# Patient Record
Sex: Female | Born: 1970 | Race: White | Hispanic: No | Marital: Single | Smoking: Current every day smoker
Health system: Southern US, Community
[De-identification: ages and names within clinical notes are randomized; demographics above are authoritative.]

## PROBLEM LIST (undated history)

## (undated) DIAGNOSIS — E785 Hyperlipidemia, unspecified: Secondary | ICD-10-CM

## (undated) DIAGNOSIS — F32A Depression, unspecified: Secondary | ICD-10-CM

## (undated) DIAGNOSIS — F329 Major depressive disorder, single episode, unspecified: Secondary | ICD-10-CM

## (undated) HISTORY — DX: Depression, unspecified: F32.A

## (undated) HISTORY — DX: Major depressive disorder, single episode, unspecified: F32.9

## (undated) HISTORY — DX: Hyperlipidemia, unspecified: E78.5

---

## 2003-08-03 ENCOUNTER — Other Ambulatory Visit: Admission: RE | Admit: 2003-08-03 | Discharge: 2003-08-03 | Payer: Self-pay | Admitting: Obstetrics and Gynecology

## 2004-12-25 ENCOUNTER — Emergency Department (HOSPITAL_COMMUNITY): Admission: EM | Admit: 2004-12-25 | Discharge: 2004-12-25 | Payer: Self-pay | Admitting: Emergency Medicine

## 2006-01-18 ENCOUNTER — Other Ambulatory Visit: Admission: RE | Admit: 2006-01-18 | Discharge: 2006-01-18 | Payer: Self-pay | Admitting: Obstetrics and Gynecology

## 2006-01-18 ENCOUNTER — Encounter: Payer: Self-pay | Admitting: Family Medicine

## 2006-08-15 ENCOUNTER — Emergency Department (HOSPITAL_COMMUNITY): Admission: EM | Admit: 2006-08-15 | Discharge: 2006-08-15 | Payer: Self-pay | Admitting: Family Medicine

## 2007-04-12 ENCOUNTER — Encounter (INDEPENDENT_AMBULATORY_CARE_PROVIDER_SITE_OTHER): Payer: Self-pay | Admitting: *Deleted

## 2007-04-12 ENCOUNTER — Encounter: Payer: Self-pay | Admitting: Family Medicine

## 2007-04-12 LAB — CONVERTED CEMR LAB
Albumin: 3.8 g/dL
Alkaline Phosphatase: 87 units/L
BUN: 9 mg/dL
CO2, serum: 24 mmol/L
Calcium: 9.1 mg/dL
Creatinine, Ser: 0.9 mg/dL
Glucose, Bld: 82 mg/dL
Sodium, serum: 135 mmol/L

## 2007-10-03 ENCOUNTER — Encounter (INDEPENDENT_AMBULATORY_CARE_PROVIDER_SITE_OTHER): Payer: Self-pay | Admitting: *Deleted

## 2007-10-03 ENCOUNTER — Encounter: Payer: Self-pay | Admitting: Family Medicine

## 2007-10-03 LAB — CONVERTED CEMR LAB
Chlamydia, Swab/Urine, PCR: NEGATIVE
Hemoglobin: 15.9 g/dL
MCV: 91.8 fL
RBC count: 5.07 10*6/uL
RPR Ser Ql: NEGATIVE
WBC, blood: 6.5 10*3/uL

## 2008-02-26 ENCOUNTER — Other Ambulatory Visit: Admission: RE | Admit: 2008-02-26 | Discharge: 2008-02-26 | Payer: Self-pay | Admitting: Family Medicine

## 2008-02-26 ENCOUNTER — Encounter: Payer: Self-pay | Admitting: Family Medicine

## 2008-02-26 ENCOUNTER — Ambulatory Visit: Payer: Self-pay | Admitting: Family Medicine

## 2008-02-26 DIAGNOSIS — F17200 Nicotine dependence, unspecified, uncomplicated: Secondary | ICD-10-CM

## 2008-02-26 DIAGNOSIS — F329 Major depressive disorder, single episode, unspecified: Secondary | ICD-10-CM

## 2008-02-26 DIAGNOSIS — F32A Depression, unspecified: Secondary | ICD-10-CM | POA: Insufficient documentation

## 2008-02-26 DIAGNOSIS — N760 Acute vaginitis: Secondary | ICD-10-CM | POA: Insufficient documentation

## 2008-03-03 ENCOUNTER — Encounter (INDEPENDENT_AMBULATORY_CARE_PROVIDER_SITE_OTHER): Payer: Self-pay | Admitting: *Deleted

## 2008-03-30 ENCOUNTER — Encounter (INDEPENDENT_AMBULATORY_CARE_PROVIDER_SITE_OTHER): Payer: Self-pay | Admitting: *Deleted

## 2008-05-11 ENCOUNTER — Ambulatory Visit: Payer: Self-pay | Admitting: Family Medicine

## 2008-05-11 DIAGNOSIS — N920 Excessive and frequent menstruation with regular cycle: Secondary | ICD-10-CM | POA: Insufficient documentation

## 2008-05-14 ENCOUNTER — Telehealth (INDEPENDENT_AMBULATORY_CARE_PROVIDER_SITE_OTHER): Payer: Self-pay | Admitting: *Deleted

## 2008-05-14 ENCOUNTER — Encounter: Payer: Self-pay | Admitting: Family Medicine

## 2008-05-14 LAB — CONVERTED CEMR LAB
Basophils Absolute: 0.2 K/uL — ABNORMAL HIGH
Basophils Relative: 3.3 % — ABNORMAL HIGH
Eosinophils Absolute: 0.4 K/uL
Eosinophils Relative: 8.1 % — ABNORMAL HIGH
HCT: 40.7 %
Hemoglobin: 14.2 g/dL
Lymphocytes Relative: 52.5 % — ABNORMAL HIGH
MCHC: 34.8 g/dL
MCV: 91.2 fL
Monocytes Absolute: 0.7 K/uL
Monocytes Relative: 14.9 % — ABNORMAL HIGH
Neutro Abs: 1 K/uL — ABNORMAL LOW
Neutrophils Relative %: 21.2 % — ABNORMAL LOW
Platelets: 244 K/uL
RBC: 4.46 M/uL
RDW: 11.7 %
WBC: 4.7 10*3/microliter

## 2008-06-17 ENCOUNTER — Ambulatory Visit: Payer: Self-pay | Admitting: Family Medicine

## 2008-06-22 ENCOUNTER — Encounter (INDEPENDENT_AMBULATORY_CARE_PROVIDER_SITE_OTHER): Payer: Self-pay | Admitting: *Deleted

## 2008-06-22 LAB — CONVERTED CEMR LAB
Basophils Absolute: 0 10*3/uL (ref 0.0–0.1)
Eosinophils Absolute: 0.2 10*3/uL (ref 0.0–0.7)
HCT: 42.2 % (ref 36.0–46.0)
Lymphs Abs: 2.7 10*3/uL (ref 0.7–4.0)
MCV: 90.8 fL (ref 78.0–100.0)
Monocytes Absolute: 0.5 10*3/uL (ref 0.1–1.0)
Platelets: 256 10*3/uL (ref 150.0–400.0)
RDW: 11.9 % (ref 11.5–14.6)

## 2009-04-26 ENCOUNTER — Telehealth (INDEPENDENT_AMBULATORY_CARE_PROVIDER_SITE_OTHER): Payer: Self-pay | Admitting: *Deleted

## 2009-05-26 ENCOUNTER — Ambulatory Visit: Payer: Self-pay | Admitting: Family

## 2009-06-15 ENCOUNTER — Telehealth (INDEPENDENT_AMBULATORY_CARE_PROVIDER_SITE_OTHER): Payer: Self-pay | Admitting: *Deleted

## 2009-09-27 ENCOUNTER — Telehealth: Payer: Self-pay | Admitting: Family Medicine

## 2009-10-05 ENCOUNTER — Telehealth: Payer: Self-pay | Admitting: Family Medicine

## 2009-11-15 ENCOUNTER — Ambulatory Visit: Payer: Self-pay | Admitting: Family Medicine

## 2009-11-15 ENCOUNTER — Other Ambulatory Visit: Admission: RE | Admit: 2009-11-15 | Discharge: 2009-11-15 | Payer: Self-pay | Admitting: Family Medicine

## 2009-11-16 LAB — CONVERTED CEMR LAB
ALT: 13 units/L (ref 0–35)
BUN: 16 mg/dL (ref 6–23)
CO2: 21 meq/L (ref 19–32)
Chloride: 106 meq/L (ref 96–112)
Cholesterol: 167 mg/dL (ref 0–200)
Eosinophils Relative: 2.3 % (ref 0.0–5.0)
Glucose, Bld: 86 mg/dL (ref 70–99)
HCT: 41.5 % (ref 36.0–46.0)
Lymphs Abs: 2.2 10*3/uL (ref 0.7–4.0)
MCV: 89.3 fL (ref 78.0–100.0)
Monocytes Absolute: 0.5 10*3/uL (ref 0.1–1.0)
Platelets: 261 10*3/uL (ref 150.0–400.0)
Potassium: 3.9 meq/L (ref 3.5–5.1)
RDW: 13.4 % (ref 11.5–14.6)
TSH: 0.75 microintl units/mL (ref 0.35–5.50)
Total Bilirubin: 0.4 mg/dL (ref 0.3–1.2)
Vit D, 25-Hydroxy: 31 ng/mL (ref 30–89)
WBC: 5.7 10*3/uL (ref 4.5–10.5)

## 2009-11-18 ENCOUNTER — Encounter (INDEPENDENT_AMBULATORY_CARE_PROVIDER_SITE_OTHER): Payer: Self-pay | Admitting: *Deleted

## 2010-04-10 ENCOUNTER — Encounter: Payer: Self-pay | Admitting: Family Medicine

## 2010-04-21 NOTE — Progress Notes (Signed)
Summary: Sertraline Refill  Phone Note Refill Request Message from:  Fax from Pharmacy on September 27, 2009 4:34 PM  Refills Requested: Medication #1:  SERTRALINE HCL 100 MG TABS take one tablet daily   Dosage confirmed as above?Dosage Confirmed   Brand Name Necessary? No   Supply Requested: 1 month   Last Refilled: 08/23/2009  Method Requested: Electronic Next Appointment Scheduled: No future appointments on file Initial call taken by: Glendell Docker CMA,  September 27, 2009 4:34 PM  Follow-up for Phone Call        ok for #30, 6 refills. Follow-up by: Neena Rhymes MD,  September 28, 2009 9:57 AM    Prescriptions: SERTRALINE HCL 100 MG TABS (SERTRALINE HCL) take one tablet daily  #30 x 6   Entered by:   Lucious Groves   Authorized by:   Neena Rhymes MD   Signed by:   Lucious Groves on 09/28/2009   Method used:   Electronically to        [facility]  [phone]* (retail)       [address]       [place], [place]  [address]       Ph: [phone] or [phone]       Fax: [phone]   RxID:   [id number]

## 2010-04-21 NOTE — Progress Notes (Signed)
Summary: sertraline refill   Phone Note Refill Request Message from:  Fax from Pharmacy on June 15, 2009 9:23 AM  Refills Requested: Medication #1:  SERTRALINE HCL 100 MG TABS take one tablet daily [facility] [address] fax [phone]   Method Requested: Fax to Local Pharmacy Next Appointment Scheduled: no appt Initial call taken by: Barb Merino,  June 15, 2009 9:24 AM  Follow-up for Phone Call        previous prescription sent to wrong pharmacy...Marland KitchenMarland KitchenDoristine Devoid  June 15, 2009 12:18 PM     Prescriptions: SERTRALINE HCL 100 MG TABS (SERTRALINE HCL) take one tablet daily  #30 x 2   Entered by:   Doristine Devoid   Authorized by:   Neena Rhymes MD   Signed by:   Doristine Devoid on 06/15/2009   Method used:   Electronically to        [facility]  [phone]* (retail)       [address]       [place], [place]  [address]       Ph: [phone] or [phone]       Fax: [phone]   RxID:   [id number]

## 2010-04-21 NOTE — Letter (Signed)
Summary: Results Follow up Letter  [place] [facility]  [address] [place], [place] [address]   Phone: [phone]  Fax: [phone]    11/18/2009 MRN: [id number]  Angela Evans [address] [place], [place]  [address]  Dear Ms. Brinlee,  The following are the results of your recent test(s):  Test         Result    Pap Smear:        Normal __X___  Not Normal _____ Comments: REPEAT 1 YR. ______________________________________________________ Cholesterol: LDL(Bad cholesterol):         Your goal is less than:         HDL (Good cholesterol):       Your goal is more than: Comments:  ______________________________________________________ Mammogram:        Normal _____  Not Normal _____ Comments:  ___________________________________________________________________ Hemoccult:        Normal _____  Not normal _______ Comments:    _____________________________________________________________________ Other Tests:    We routinely do not discuss normal results over the telephone.  If you desire a copy of the results, or you have any questions about this information we can discuss them at your next office visit.   Sincerely,

## 2010-04-21 NOTE — Progress Notes (Signed)
Summary: refill -   Phone Note Refill Request Message from:  Patient on October 05, 2009 10:04 AM  Refills Requested: Medication #1:  SERTRALINE HCL 100 MG TABS take one tablet daily refill on 071211 was faxed to wrong  pharmacy - please resend to [facility] [place] to refill again?  Initial call taken by: Okey Regal [place],  October 05, 2009 10:05 AM  Follow-up for Phone Call        done.  please notify pt. Follow-up by: Neena Rhymes MD,  October 05, 2009 11:36 AM  Additional Follow-up for Phone Call Additional follow up Details #1::        Pt is aware that med has been refilled Additional Follow-up by: Kathrynn Speed CMA,  October 05, 2009 11:59 AM    Prescriptions: SERTRALINE HCL 100 MG TABS (SERTRALINE HCL) take one tablet daily  #30 x 6   Entered and Authorized by:   Neena Rhymes MD   Signed by:   Neena Rhymes MD on 10/05/2009   Method used:   Electronically to        [facility]* (retail)       [address]       [place], [place]  [id number]       Ph: [phone]       Fax: [phone]   RxID:   [id number]

## 2010-04-21 NOTE — Assessment & Plan Note (Signed)
Summary: cpx/pap/fasting/kn   Vital Signs:  Patient profile:   40 year old female Height:      69.50 inches Weight:      191 pounds BMI:     27.90 Pulse rate:   74 / minute BP sitting:   120 / 78  (left arm)  Vitals Entered By: Doristine Devoid CMA (November 15, 2009 8:08 AM) CC: CPX AND LABS w/ PAP   History of Present Illness: 40 yo woman here today for CPE w/ pap.  no concerns.    Preventive Screening-Counseling & Management  Alcohol-Tobacco     Alcohol drinks/day: <1     Smoking Status: quit     Year Quit: 2011  Caffeine-Diet-Exercise     Does Patient Exercise: yes     Type of exercise: training for 1/2 marathon      Drug Use:  never.    Problems Prior to Update: 1)  Menorrhagia  (ICD-626.2) 2)  Routine Gynecological Examination  (ICD-V72.31) 3)  Screening For Malignant Neoplasm of The Cervix  (ICD-V76.2) 4)  Vaginitis  (ICD-616.10) 5)  Tobacco Use  (ICD-305.1) 6)  Depression  (ICD-311)  Current Medications (verified): 1)  Sertraline Hcl 100 Mg Tabs (Sertraline Hcl) .... Take One Tablet Daily  Allergies (verified): 1)  ! Penicillin  Past History:  Past Medical History: Last updated: 02/26/2008 Depression  Past Surgical History: Last updated: 02/26/2008 none  Family History: Last updated: 02/26/2008 CAD-father HTN-father DM-no Steward [place]one COLON CA-no BREAST CA-no  Social History: Last updated: 02/26/2008 divorced, mother of 1 daughter (1997), current smoker (1/2 ppd)  Social History: Does Patient Exercise:  yes Smoking Status:  quit Drug Use:  never  Review of Systems  The patient denies anorexia, fever, weight loss, weight gain, vision loss, decreased hearing, hoarseness, chest pain, syncope, dyspnea on exertion, peripheral edema, prolonged cough, headaches, abdominal pain, melena, hematochezia, severe indigestion/heartburn, hematuria, suspicious skin lesions, depression, abnormal bleeding, enlarged lymph nodes, and breast masses.    Physical  Exam  General:  Well-developed,well-nourished,in no acute distress; alert,appropriate and cooperative throughout examination Head:  Normocephalic and atraumatic without obvious abnormalities. No apparent alopecia or balding. Eyes:  No corneal or conjunctival inflammation noted. EOMI. [place]. Funduscopic exam benign, without hemorrhages, exudates or papilledema. Vision grossly normal. Ears:  External ear exam shows no significant lesions or deformities.  Otoscopic examination reveals clear canals, tympanic membranes are intact bilaterally without bulging, retraction, inflammation or discharge. Hearing is grossly normal bilaterally. Nose:  External nasal examination shows no deformity or inflammation. Nasal mucosa are pink and moist without lesions or exudates. Mouth:  Oral mucosa and oropharynx without lesions or exudates.  Teeth in good repair. Neck:  No deformities, masses, or tenderness noted. Breasts:  No mass, nodules, thickening, tenderness, bulging, retraction, inflamation, nipple discharge or skin changes noted.   Lungs:  Normal respiratory effort, chest expands symmetrically. Lungs are clear to auscultation, no crackles or wheezes. Heart:  Normal rate and regular rhythm. S1 and S2 normal without gallop, murmur, click, rub or other extra sounds. Abdomen:  Bowel sounds positive,abdomen soft and non-tender without masses, organomegaly or hernias noted. Genitalia:  Pelvic Exam:        External: normal female genitalia without lesions or masses        Vagina: normal without lesions or masses        Cervix: normal without lesions or masses, + menses from os        Pap smear: performed Pulses:  +2 carotid, radial, DP Extremities:  No  clubbing, cyanosis, edema, or deformity noted with normal full range of motion of all joints.   Neurologic:  No cranial nerve deficits noted. Station and gait are normal. Plantar reflexes are down-going bilaterally. DTRs are symmetrical throughout. Sensory, motor  and coordinative functions appear intact. Skin:  Intact without suspicious lesions or rashes Cervical Nodes:  No lymphadenopathy noted Axillary Nodes:  No palpable lymphadenopathy Psych:  Cognition and judgment appear intact. Alert and cooperative with normal attention span and concentration. No apparent delusions, illusions, hallucinations   Impression & Recommendations:  Problem # 1:  ROUTINE GYNECOLOGICAL EXAMINATION (ICD-V72.31) Assessment Unchanged pt's PE WNL.  check labs.  anticipatory guidance provided. Orders: Venipuncture ([address]) TLB-Lipid Panel (80061-LIPID) TLB-BMP (Basic Metabolic Panel-BMET) (80048-METABOL) TLB-CBC Platelet - w/Differential (85025-CBCD) TLB-Hepatic/Liver Function Pnl (80076-HEPATIC) TLB-TSH (Thyroid Stimulating Hormone) (84443-TSH) T-Vitamin D (25-Hydroxy) (60454-098[address])  Problem # 2:  SCREENING FOR MALIGNANT NEOPLASM OF THE CERVIX (ICD-V76.2) Assessment: Unchanged pap collected  Complete Medication List: 1)  Sertraline Hcl 100 Mg Tabs (Sertraline hcl) .... Take one tablet daily  Patient Instructions: 1)  Follow up in 1 year or as needed 2)  We'll notify you of your lab results 3)  Your exam looks great! 4)  Call with any questions or concerns 5)  Good luck with your training!!

## 2010-04-21 NOTE — Progress Notes (Signed)
Summary: zoloft refill -need appt   Phone Note Refill Request Message from:  [facility]  fax [phone]  zoloft 100mg   Initial call taken by: Barb Merino,  April 26, 2009 12:14 PM  Follow-up for Phone Call        pls schedule appt for patient before refill..........Marland KitchenDoristine Devoid  April 27, 2009 10:19 AM     Additional Follow-up for Phone Call Additional follow up Details #2::    left message to call office.Marland KitchenMarland KitchenBarb Merino  April 27, 2009 10:51 AM  patient states she does not have insurance to the first of march. Patient states she is dizzy because she has not had her meds.Marland KitchenMarland KitchenMarland KitchenBarb Merino  April 28, 2009 2:28 PM   Follow-up by: Barb Merino,  April 27, 2009 10:51 AM  Additional Follow-up for Phone Call Additional follow up Details #3:: Details for Additional Follow-up Action Taken:  will refill this time but patient will need to schedule appt before next refill ........Marland KitchenDoristine Devoid  April 28, 2009 2:50 PM   Prescriptions: SERTRALINE HCL 100 MG TABS (SERTRALINE HCL) take one tablet daily  #30 x 0   Entered by:   Doristine Devoid   Authorized by:   Neena Rhymes MD   Signed by:   Doristine Devoid on 04/28/2009   Method used:   Telephoned to ...       [facility]  [address]  [phone]* (retail)       [address]       [place], [place]  [address]       Ph: [phone] or [phone]       Fax: [phone]   RxID:   [id number]

## 2010-04-21 NOTE — Assessment & Plan Note (Signed)
Summary: fu on meds/kdc   Vital Signs:  Patient profile:   40 year old female Weight:      197.25 pounds Temp:     97.2 degrees F oral Pulse rate:   72 / minute Pulse rhythm:   regular Resp:     16 per minute BP sitting:   120 / 70  (right arm) Cuff size:   regular  Vitals Entered By: Mervin Kung CMA (May 26, 2009 8:17 AM) CC: room 17  Follow up on medication for depressijon   CC:  room 17  Follow up on medication for depressijon.  History of Present Illness: Angela Evans is a 40 yr old female who presents today for follow up of her depression.  Pt feels that her depression is well controled on sertraline and she wants to continue the same.  Denies suicide ideation or anxiety.   Tobacco Abuse- started chantix 3 weeks ago, down to 4 cigarettes a day.  Requests refill be sent to her pharmacy.    Allergies: 1)  ! Penicillin  Physical Exam  General:  Well-developed,well-nourished,in no acute distress; alert,appropriate and cooperative throughout examination Head:  Normocephalic and atraumatic without obvious abnormalities. No apparent alopecia or balding. Psych:  Cognition and judgment appear intact. Alert and cooperative with normal attention span and concentration. No apparent delusions, illusions, hallucinations   Impression & Recommendations:  Problem # 1:  DEPRESSION (ICD-311) Assessment Improved This is well controlled.  Will continue same.  15 minutes was spent with patient, greater than 50% of this time was spent on counseling regarding patient's depression and smoking cessation.   Her updated medication list for this problem includes:    Sertraline Hcl 100 Mg Tabs (Sertraline hcl) .Marland Kitchen... Take one tablet daily  Problem # 2:  TOBACCO USE (ICD-305.1) Assessment: Improved  Pt requests continuation pack of chantix.   Initially requested that this refill be sent to [facility]- then requested [facility].  I called [facility] and cancelled prescription there.   The following  medications were removed from the medication list:    Chantix Starting Month Pak 0.5 Mg X 11 & 1 Mg X 42 Misc (Varenicline tartrate) .Marland Kitchen... Take as directed Her updated medication list for this problem includes:    Chantix Continuing Month Pak 1 Mg Tabs (Varenicline tartrate) .Marland Kitchen... Take as directed (1 tab by mouth two times a day)  Orders: Tobacco use cessation intermediate 3-10 minutes ([address])  Complete Medication List: 1)  Sertraline Hcl 100 Mg Tabs (Sertraline hcl) .... Take one tablet daily 2)  Chantix Continuing Month Pak 1 Mg Tabs (Varenicline tartrate) .... Take as directed (1 tab by mouth two times a day)  Patient Instructions: 1)  Keep up the good work with quitting smoking! 2)  Please arrange a follow up appointment for a complete physical. Prescriptions: SERTRALINE HCL 100 MG TABS (SERTRALINE HCL) take one tablet daily  #30 x 2   Entered and Authorized by:   Lemont Fillers FNP   Signed by:   Lemont Fillers FNP on 05/26/2009   Method used:   Electronically to        [facility]* (retail)       [address]       [place], [place]  [id number]       Ph: [phone]       Fax: [phone]   RxID:   [id number] CHANTIX CONTINUING MONTH PAK 1 MG TABS (VARENICLINE TARTRATE) take as directed (1 tab by mouth  two times a day)  #1 x 1   Entered and Authorized by:   Lemont Fillers FNP   Signed by:   Lemont Fillers FNP on 05/26/2009   Method used:   Electronically to        [facility]* (retail)       [address]       [place], [place]  [address]       Ph: [phone]       Fax: [phone]   RxID:   [id number] CHANTIX CONTINUING MONTH PAK 1 MG TABS (VARENICLINE TARTRATE) take as directed (1 tab by mouth two times a day)  #1 x 1   Entered and Authorized by:   Lemont Fillers FNP   Signed by:   Lemont Fillers FNP on 05/26/2009   Method used:   Electronically to        [facility]  [id number]*  (retail)       [address]       [place], [place]  [address]       Ph: [phone] or [phone]       Fax: [phone]   RxID:   [id number]   Current Allergies (reviewed today): ! PENICILLIN

## 2010-06-20 ENCOUNTER — Other Ambulatory Visit: Payer: Self-pay | Admitting: Family Medicine

## 2010-06-20 NOTE — Telephone Encounter (Signed)
Refilled, per [facility] pt was to follow up in one year.

## 2010-12-28 ENCOUNTER — Other Ambulatory Visit: Payer: Self-pay | Admitting: Family Medicine

## 2011-02-01 ENCOUNTER — Encounter: Payer: Self-pay | Admitting: Family Medicine

## 2011-02-02 ENCOUNTER — Ambulatory Visit: Payer: Self-pay | Admitting: Family Medicine

## 2011-02-02 ENCOUNTER — Telehealth: Payer: Self-pay | Admitting: Family Medicine

## 2011-02-02 NOTE — Telephone Encounter (Signed)
Patient had appt [phone] for med check - ( md out of office) she is rescheduled for [phone] needs refill for sertraline - [facility]

## 2011-02-02 NOTE — Telephone Encounter (Signed)
Pt was scheduled for 02-02-11 and is requesting Zoloft last refill is 12-28-10

## 2011-02-03 MED ORDER — SERTRALINE HCL 100 MG PO TABS: 100.0000 mg | ORAL_TABLET | Freq: Every day | ORAL | Status: DC

## 2011-02-03 NOTE — Telephone Encounter (Signed)
Ok to refill x 6  

## 2011-02-16 ENCOUNTER — Encounter: Payer: Self-pay | Admitting: Family Medicine

## 2011-02-16 ENCOUNTER — Ambulatory Visit (INDEPENDENT_AMBULATORY_CARE_PROVIDER_SITE_OTHER): Payer: BC Managed Care – PPO | Admitting: Family Medicine

## 2011-02-16 VITALS — BP 118/75 | HR 79 | Temp 98.3°F | Ht 68.0 in | Wt 185.0 lb

## 2011-02-16 DIAGNOSIS — F329 Major depressive disorder, single episode, unspecified: Secondary | ICD-10-CM

## 2011-02-16 MED ORDER — SERTRALINE HCL 100 MG PO TABS: ORAL_TABLET | ORAL | Status: DC

## 2011-02-16 NOTE — Patient Instructions (Signed)
Schedule your complete physical for August Increase the Zoloft to 1.5 tabs daily Switch the Zoloft to dinner Call with any questions or concerns Happy Holidays!!!

## 2011-02-16 NOTE — Progress Notes (Signed)
  Subjective:    Patient ID: Angela Evans, female    DOB: May 23, 1970, 40 y.o.   MRN: [id number]  HPI Depression- denies current sadness, tearfulness, social isolation.  No change in appetite, no problems w/ sleep.  'there's nothing you'd be able to do for the depression anyway- right?'  Review of Systems For ROS see HPI     Objective:   Physical Exam  Vitals reviewed. Constitutional: She appears well-developed and well-nourished. No distress.  Psychiatric: She has a normal mood and affect. Her behavior is normal. Judgment and thought content normal.          Assessment & Plan:

## 2011-02-16 NOTE — Assessment & Plan Note (Signed)
After discussion w/ pt re sxs and possible txs- pt interested in increasing dose to 150mg  daily.  Script provided.  Will follow closely.

## 2011-06-29 ENCOUNTER — Ambulatory Visit: Payer: BC Managed Care – PPO | Admitting: Family Medicine

## 2011-07-03 ENCOUNTER — Encounter: Payer: Self-pay | Admitting: Family Medicine

## 2011-07-03 ENCOUNTER — Ambulatory Visit (INDEPENDENT_AMBULATORY_CARE_PROVIDER_SITE_OTHER): Payer: No Typology Code available for payment source | Admitting: Family Medicine

## 2011-07-03 VITALS — BP 125/82 | HR 60 | Temp 98.2°F | Ht 68.0 in | Wt 201.0 lb

## 2011-07-03 DIAGNOSIS — R635 Abnormal weight gain: Secondary | ICD-10-CM

## 2011-07-03 DIAGNOSIS — F329 Major depressive disorder, single episode, unspecified: Secondary | ICD-10-CM

## 2011-07-03 DIAGNOSIS — S93409A Sprain of unspecified ligament of unspecified ankle, initial encounter: Secondary | ICD-10-CM

## 2011-07-03 MED ORDER — VENLAFAXINE HCL ER 37.5 MG PO CP24: 37.5000 mg | ORAL_CAPSULE | Freq: Every day | ORAL | Status: DC

## 2011-07-03 NOTE — Progress Notes (Signed)
  Subjective:    Patient ID: Angela Evans, female    DOB: [birth year], 41 y.o.   MRN: [id number]  HPI Depression- pt concerned that Zoloft is causing her to gain weight.  Still fatigued.  Not sure if zoloft is helping w/ depression.  Has decreased dose to 100mg .  Ankle injury- R, rolled yesterday (inverted).  No swelling but was unable to bear weight last night.  Improved today.  No bruising.   Review of Systems For ROS see HPI     Objective:   Physical Exam  Vitals reviewed. Constitutional: She appears well-developed and well-nourished. No distress.  Musculoskeletal:       Right ankle: She exhibits no swelling and no ecchymosis. tenderness. AITFL tenderness found. No lateral malleolus, no medial malleolus, no CF ligament, no posterior TFL, no head of 5th metatarsal and no proximal fibula tenderness found.  Psychiatric: She has a normal mood and affect. Her behavior is normal. Judgment and thought content normal.          Assessment & Plan:

## 2011-07-03 NOTE — Patient Instructions (Signed)
Follow up in 1 month to recheck mood and weight We'll notify you of your lab results STOP the Zoloft START the Effexor daily Your ankle is mildly sprained- ice, good supportive shoes, wear your ankle brace when exercising, ibuprofen as needed Call with any questions or concerns Hang in there!!!

## 2011-07-06 ENCOUNTER — Encounter: Payer: Self-pay | Admitting: *Deleted

## 2011-07-17 ENCOUNTER — Encounter: Payer: Self-pay | Admitting: Family Medicine

## 2011-07-17 MED ORDER — VENLAFAXINE HCL ER 37.5 MG PO CP24: 75.0000 mg | ORAL_CAPSULE | Freq: Every day | ORAL | Status: DC

## 2011-07-17 NOTE — Telephone Encounter (Signed)
Please advise if pt needs to come in for OV

## 2011-07-18 DIAGNOSIS — S93409A Sprain of unspecified ligament of unspecified ankle, initial encounter: Secondary | ICD-10-CM | POA: Insufficient documentation

## 2011-07-18 NOTE — Assessment & Plan Note (Signed)
New.  Pt w/out bony tenderness, no swelling or bruising noted.  Point tender over ATFL.  RICE.  Reviewed supportive care and red flags that should prompt return.  Pt expressed understanding and is in agreement w/ plan.

## 2011-07-18 NOTE — Assessment & Plan Note (Signed)
Unchanged.  Pt doesn't feel Zoloft is helpful and is actually more upset b/c she feels it is causing her to gain weight.  Will switch to Effexor and follow closely.

## 2011-07-18 NOTE — Assessment & Plan Note (Signed)
New.  Pt feels this is due to Zoloft.  Exercising regularly w/out results.  Will switch to effexor and monitor.

## 2011-07-25 ENCOUNTER — Other Ambulatory Visit: Payer: Self-pay | Admitting: *Deleted

## 2011-07-25 MED ORDER — VENLAFAXINE HCL 75 MG PO TABS: 75.0000 mg | ORAL_TABLET | Freq: Every day | ORAL | Status: DC

## 2011-07-25 NOTE — Telephone Encounter (Signed)
Noted pt clarification, pt tried to pick up Rx and noted that she takes 2 tablets per day now,called pharmacy to speak to [facility] and advised that a RX was faxed in on 07-17-11 for pt to take 2 tablets daily of the 37.5mg  Venalaxine, pharmacist mike advised they did not receive this medication request, gave verbal order per MD Tabori that the pt is to switch to Venlafaxine 75mg  take one tablet daily #30 with 3 refills, pharmacist understood

## 2011-08-03 ENCOUNTER — Ambulatory Visit (INDEPENDENT_AMBULATORY_CARE_PROVIDER_SITE_OTHER): Payer: No Typology Code available for payment source | Admitting: Family Medicine

## 2011-08-03 ENCOUNTER — Encounter: Payer: Self-pay | Admitting: Family Medicine

## 2011-08-03 VITALS — BP 128/80 | HR 64 | Temp 97.9°F | Ht 69.25 in | Wt 195.0 lb

## 2011-08-03 DIAGNOSIS — N920 Excessive and frequent menstruation with regular cycle: Secondary | ICD-10-CM

## 2011-08-03 DIAGNOSIS — R635 Abnormal weight gain: Secondary | ICD-10-CM

## 2011-08-03 DIAGNOSIS — F329 Major depressive disorder, single episode, unspecified: Secondary | ICD-10-CM

## 2011-08-03 MED ORDER — LEVONORGESTREL-ETHINYL ESTRAD 0.1-20 MG-MCG PO TABS: 1.0000 | ORAL_TABLET | Freq: Every day | ORAL | Status: DC

## 2011-08-03 NOTE — Patient Instructions (Signed)
Start the birth control the Sunday after you start bleeding Continue the Effexor Keep up the good work!  You look great! Have a great summer!

## 2011-08-03 NOTE — Progress Notes (Signed)
  Subjective:    Patient ID: Angela Evans, female    DOB: 1971-02-27, 40 y.o.   MRN: [id number]  HPI Depression- was switched from Zoloft to Effexor at last visit.  Feels less sedated, increased energy, mood has improved.  Weight gain- has lost 5 lbs since last visit.  Continues to exercise regularly.  Irregular menses- having spotting that starts 1 week prior to 'actual period'.  Having bleeding for 2 weeks out of each month.  Getting frustrated w/ this.  No longer smoking.  No family hx of clots.   Review of Systems For ROS see HPI     Objective:   Physical Exam  Vitals reviewed. Constitutional: She appears well-developed and well-nourished. No distress.  Skin: Skin is warm and dry.  Psychiatric: She has a normal mood and affect. Her behavior is normal. Judgment and thought content normal.          Assessment & Plan:

## 2011-08-05 NOTE — Assessment & Plan Note (Signed)
Improved since starting effexor.  Feels things are stable at current dose.  No changes.

## 2011-08-05 NOTE — Assessment & Plan Note (Signed)
Improved.  Pt has lost 5 lbs since last visit.  Will continue to follow.

## 2011-08-05 NOTE — Assessment & Plan Note (Signed)
New.  Pt is getting very frustrated w/ menstrual irregularities.  Would like to start low dose OCPs to try and better regulate.  Will follow closely.

## 2011-08-29 ENCOUNTER — Encounter: Payer: Self-pay | Admitting: Family Medicine

## 2011-08-29 NOTE — Telephone Encounter (Signed)
Please note and advise 

## 2011-08-30 MED ORDER — VENLAFAXINE HCL ER 150 MG PO CP24: 150.0000 mg | ORAL_CAPSULE | Freq: Every day | ORAL | Status: DC

## 2011-08-30 NOTE — Telephone Encounter (Signed)
Pt noted she increased her venlafaxine to 150mg  XR and requested new RX sent to [facility], MD Beverely Low advised to send new rx to pt, sent new rx via escribe pt notified via my chart

## 2012-04-11 ENCOUNTER — Telehealth: Payer: Self-pay | Admitting: Family Medicine

## 2012-04-11 DIAGNOSIS — F329 Major depressive disorder, single episode, unspecified: Secondary | ICD-10-CM

## 2012-04-11 DIAGNOSIS — F32A Depression, unspecified: Secondary | ICD-10-CM

## 2012-04-11 MED ORDER — VENLAFAXINE HCL ER 150 MG PO CP24: 150.0000 mg | ORAL_CAPSULE | Freq: Every day | ORAL | Status: DC

## 2012-04-11 NOTE — Telephone Encounter (Signed)
Refill for Venlafaxine HCL ER sent to [facility]

## 2012-04-11 NOTE — Telephone Encounter (Signed)
refill Venlafaxine HCl ER 150 MG Take 1 capsule (150 mg total) by mouth daily. #7--per fax last fill 1.22.14

## 2012-05-04 ENCOUNTER — Other Ambulatory Visit: Payer: Self-pay

## 2012-12-19 ENCOUNTER — Other Ambulatory Visit: Payer: Self-pay | Admitting: Family Medicine

## 2012-12-19 NOTE — Telephone Encounter (Signed)
Pt cannot receive refills without an OV>

## 2013-01-17 ENCOUNTER — Telehealth: Payer: Self-pay

## 2013-01-17 NOTE — Telephone Encounter (Addendum)
Left message for call back identifiable  PAP? MMG? Flu? Unable to reach prior to visit

## 2013-01-20 ENCOUNTER — Encounter: Payer: Self-pay | Admitting: Family Medicine

## 2013-01-20 ENCOUNTER — Ambulatory Visit (INDEPENDENT_AMBULATORY_CARE_PROVIDER_SITE_OTHER): Payer: Managed Care, Other (non HMO) | Admitting: Family Medicine

## 2013-01-20 ENCOUNTER — Other Ambulatory Visit: Payer: Self-pay | Admitting: Family Medicine

## 2013-01-20 VITALS — BP 122/82 | HR 76 | Temp 98.2°F | Resp 16 | Ht 69.0 in | Wt 168.2 lb

## 2013-01-20 DIAGNOSIS — Z0000 Encounter for general adult medical examination without abnormal findings: Secondary | ICD-10-CM | POA: Insufficient documentation

## 2013-01-20 DIAGNOSIS — F329 Major depressive disorder, single episode, unspecified: Secondary | ICD-10-CM

## 2013-01-20 DIAGNOSIS — F17200 Nicotine dependence, unspecified, uncomplicated: Secondary | ICD-10-CM

## 2013-01-20 DIAGNOSIS — Z1231 Encounter for screening mammogram for malignant neoplasm of breast: Secondary | ICD-10-CM

## 2013-01-20 LAB — BASIC METABOLIC PANEL
GFR: 63.56 mL/min (ref >60.00)
Potassium: 3.9 mEq/L (ref 3.5–5.1)
Sodium: 136 mEq/L (ref 135–145)

## 2013-01-20 LAB — CBC WITH DIFFERENTIAL/PLATELET
Basophils Absolute: 0 10*3/uL (ref 0.0–0.1)
Eosinophils Absolute: 0.1 10*3/uL (ref 0.0–0.7)
MCHC: 34.7 g/dL (ref 30.0–36.0)
MCV: 87.8 fl (ref 78.0–100.0)
Monocytes Absolute: 0.4 10*3/uL (ref 0.1–1.0)
Neutrophils Relative %: 58.3 % (ref 43.0–77.0)
Platelets: 326 10*3/uL (ref 150.0–400.0)
RDW: 12.6 % (ref 11.5–14.6)
WBC: 7.8 10*3/uL (ref 4.5–10.5)

## 2013-01-20 LAB — LIPID PANEL
HDL: 38.4 mg/dL — ABNORMAL LOW (ref >39.00)
VLDL: 25 mg/dL (ref 0.0–40.0)

## 2013-01-20 LAB — HEPATIC FUNCTION PANEL
AST: 13 U/L (ref 0–37)
Alkaline Phosphatase: 84 U/L (ref 39–117)
Total Bilirubin: 0.6 mg/dL (ref 0.3–1.2)

## 2013-01-20 MED ORDER — VARENICLINE TARTRATE 1 MG PO TABS: 1.0000 mg | ORAL_TABLET | Freq: Two times a day (BID) | ORAL | Status: DC

## 2013-01-20 MED ORDER — VARENICLINE TARTRATE 0.5 MG X 11 & 1 MG X 42 PO MISC: ORAL | Status: DC

## 2013-01-20 MED ORDER — VENLAFAXINE HCL ER 75 MG PO CP24: 75.0000 mg | ORAL_CAPSULE | Freq: Every day | ORAL | Status: DC

## 2013-01-20 NOTE — Assessment & Plan Note (Signed)
Pt's current dose of medication is too high as she is experiencing almost immediate withdrawal sxs if med is late.  Decrease to 75mg  daily and monitor.

## 2013-01-20 NOTE — Assessment & Plan Note (Signed)
Pt's PE WNL.  Due for mammo- order entered.  Due for pap- pt declines.  Check labs.  Anticipatory guidance provided.

## 2013-01-20 NOTE — Assessment & Plan Note (Signed)
Recurrent issue for pt.  Restart Chantix as this has worked well for pt in the past.

## 2013-01-20 NOTE — Progress Notes (Signed)
  Subjective:    Patient ID: Angela Evans, female    DOB: 1971/03/05, 42 y.o.   MRN: [id number]  HPI CPE- due for pap but not interested in doing this today.  Due for mammo.  Tobacco use- had previously quit on Chantix but switched jobs and resumed smoking w/ increased stress.  Would like to resume Chantix  Depression- pt notes that if she misses a dose of Effexor or takes it later than usual, will have nausea, dizziness, HA.  This was not the case previously.   Review of Systems Patient reports no vision/ hearing changes, adenopathy,fever, weight change,  persistant/recurrent hoarseness , swallowing issues, chest pain, palpitations, edema, persistant/recurrent cough, hemoptysis, dyspnea (rest/exertional/paroxysmal nocturnal), gastrointestinal bleeding (melena, rectal bleeding), abdominal pain, significant heartburn, bowel changes, GU symptoms (dysuria, hematuria, incontinence), Gyn symptoms (abnormal  bleeding, pain),  syncope, focal weakness, memory loss, numbness & tingling, skin/hair/nail changes, abnormal bruising or bleeding, anxiety, or depression.     Objective:   Physical Exam  General Appearance:    Alert, cooperative, no distress, appears stated age  Head:    Normocephalic, without obvious abnormality, atraumatic  Eyes:    PERRL, conjunctiva/corneas clear, EOM's intact, fundi    benign, both eyes  Ears:    Normal TM's and external ear canals, both ears  Nose:   Nares normal, septum midline, mucosa normal, no drainage    or sinus tenderness  Throat:   Lips, mucosa, and tongue normal; teeth and gums normal  Neck:   Supple, symmetrical, trachea midline, no adenopathy;    Thyroid: no enlargement/tenderness/nodules  Back:     Symmetric, no curvature, ROM normal, no CVA tenderness  Lungs:     Clear to auscultation bilaterally, respirations unlabored  Chest Wall:    No tenderness or deformity   Heart:    Regular rate and rhythm, S1 and S2 normal, no murmur, rub   or gallop   Breast Exam:    No tenderness, masses, or nipple abnormality  Abdomen:     Soft, non-tender, bowel sounds active all four quadrants,    no masses, no organomegaly  Genitalia:    Deferred at pt's request  Rectal:    Extremities:   Extremities normal, atraumatic, no cyanosis or edema  Pulses:   2+ and symmetric all extremities  Skin:   Skin color, texture, turgor normal, no rashes or lesions  Lymph nodes:   Cervical, supraclavicular, and axillary nodes normal  Neurologic:   CNII-XII intact, normal strength, sensation and reflexes    throughout          Assessment & Plan:

## 2013-01-20 NOTE — Patient Instructions (Signed)
Schedule your pap at your convenience We'll notify you of your lab results and make any changes if needed Start the Chantix- starting month as directed and then the continuing months.  You can do this! Decrease the Effexor to 75mg  daily We'll call you with your mammo appt Call with any questions or concerns Happy Holidays!

## 2013-01-21 ENCOUNTER — Other Ambulatory Visit: Payer: Self-pay | Admitting: General Practice

## 2013-01-21 ENCOUNTER — Telehealth: Payer: Self-pay | Admitting: Family Medicine

## 2013-01-21 DIAGNOSIS — E785 Hyperlipidemia, unspecified: Secondary | ICD-10-CM

## 2013-01-21 MED ORDER — ATORVASTATIN CALCIUM 20 MG PO TABS: 20.0000 mg | ORAL_TABLET | Freq: Every day | ORAL | Status: DC

## 2013-01-21 NOTE — Telephone Encounter (Signed)
Patient is calling requesting to speak with Shanda Bumps about her lab results. States that she would like more information about her cholesterol and its ratio. Please advise.

## 2013-01-22 NOTE — Telephone Encounter (Signed)
Called pt and lmovm to return call.

## 2013-01-23 LAB — VITAMIN D 1,25 DIHYDROXY
Vitamin D 1, 25 (OH)2 Total: 41 pg/mL (ref 18–72)
Vitamin D3 1, 25 (OH)2: 41 pg/mL

## 2013-01-23 NOTE — Telephone Encounter (Signed)
Cannot reach pt and encounter closed until pt returns call.

## 2013-01-28 ENCOUNTER — Encounter: Payer: Self-pay | Admitting: Family Medicine

## 2013-01-29 ENCOUNTER — Other Ambulatory Visit: Payer: Self-pay | Admitting: Family Medicine

## 2013-02-25 ENCOUNTER — Ambulatory Visit: Payer: Managed Care, Other (non HMO)

## 2013-02-25 ENCOUNTER — Ambulatory Visit
Admission: RE | Admit: 2013-02-25 | Discharge: 2013-02-25 | Disposition: A | Discharge status: Home / Self Care | Payer: Managed Care, Other (non HMO) | Source: Ambulatory Visit | Attending: Family Medicine | Admitting: Family Medicine

## 2013-02-25 DIAGNOSIS — Z1231 Encounter for screening mammogram for malignant neoplasm of breast: Secondary | ICD-10-CM

## 2013-03-04 ENCOUNTER — Other Ambulatory Visit: Payer: Managed Care, Other (non HMO)

## 2013-04-11 ENCOUNTER — Encounter: Payer: Self-pay | Admitting: Family Medicine

## 2013-04-11 MED ORDER — VENLAFAXINE HCL ER 150 MG PO CP24: 150.0000 mg | ORAL_CAPSULE | Freq: Every day | ORAL | Status: DC

## 2013-11-07 ENCOUNTER — Ambulatory Visit: Payer: Managed Care, Other (non HMO) | Admitting: Medical

## 2013-11-07 DIAGNOSIS — Z0289 Encounter for other administrative examinations: Secondary | ICD-10-CM

## 2013-11-13 ENCOUNTER — Other Ambulatory Visit: Payer: Self-pay | Admitting: Family Medicine

## 2013-11-13 NOTE — Telephone Encounter (Signed)
Med filled.  

## 2013-12-16 ENCOUNTER — Ambulatory Visit (INDEPENDENT_AMBULATORY_CARE_PROVIDER_SITE_OTHER): Payer: 59 | Admitting: Family Medicine

## 2013-12-16 ENCOUNTER — Encounter: Payer: Self-pay | Admitting: Family Medicine

## 2013-12-16 VITALS — BP 130/84 | HR 64 | Temp 97.9°F | Resp 16 | Wt 195.5 lb

## 2013-12-16 DIAGNOSIS — N920 Excessive and frequent menstruation with regular cycle: Secondary | ICD-10-CM

## 2013-12-16 DIAGNOSIS — R319 Hematuria, unspecified: Secondary | ICD-10-CM

## 2013-12-16 DIAGNOSIS — R350 Frequency of micturition: Secondary | ICD-10-CM | POA: Insufficient documentation

## 2013-12-16 DIAGNOSIS — R109 Unspecified abdominal pain: Secondary | ICD-10-CM

## 2013-12-16 LAB — BASIC METABOLIC PANEL
BUN: 9 mg/dL (ref 6–23)
CALCIUM: 9 mg/dL (ref 8.4–10.5)
CO2: 22 mEq/L (ref 19–32)
CREATININE: 1 mg/dL (ref 0.4–1.2)
Chloride: 104 mEq/L (ref 96–112)
GFR: 66.27 mL/min (ref >60.00)
Glucose, Bld: 85 mg/dL (ref 70–99)
Potassium: 3.7 mEq/L (ref 3.5–5.1)
SODIUM: 134 meq/L — AB (ref 135–145)

## 2013-12-16 LAB — CBC WITH DIFFERENTIAL/PLATELET
BASOS ABS: 0 10*3/uL (ref 0.0–0.1)
Basophils Relative: 0.6 % (ref 0.0–3.0)
EOS ABS: 0.2 10*3/uL (ref 0.0–0.7)
Eosinophils Relative: 2.2 % (ref 0.0–5.0)
HCT: 45.6 % (ref 36.0–46.0)
HEMOGLOBIN: 15.4 g/dL — AB (ref 12.0–15.0)
LYMPHS ABS: 2.5 10*3/uL (ref 0.7–4.0)
Lymphocytes Relative: 28.7 % (ref 12.0–46.0)
MCHC: 33.7 g/dL (ref 30.0–36.0)
MCV: 91.4 fl (ref 78.0–100.0)
Monocytes Absolute: 0.5 10*3/uL (ref 0.1–1.0)
Monocytes Relative: 5.6 % (ref 3.0–12.0)
NEUTROS ABS: 5.4 10*3/uL (ref 1.4–7.7)
Neutrophils Relative %: 62.9 % (ref 43.0–77.0)
PLATELETS: 319 10*3/uL (ref 150.0–400.0)
RBC: 4.99 Mil/uL (ref 3.87–5.11)
RDW: 13.5 % (ref 11.5–15.5)
WBC: 8.6 10*3/uL (ref 4.0–10.5)

## 2013-12-16 LAB — HEPATIC FUNCTION PANEL
ALT: 14 U/L (ref 0–35)
AST: 16 U/L (ref 0–37)
Albumin: 4.2 g/dL (ref 3.5–5.2)
Alkaline Phosphatase: 92 U/L (ref 39–117)
BILIRUBIN DIRECT: 0 mg/dL (ref 0.0–0.3)
Total Bilirubin: 0.5 mg/dL (ref 0.2–1.2)
Total Protein: 7.2 g/dL (ref 6.0–8.3)

## 2013-12-16 LAB — POCT URINE PREGNANCY: Preg Test, Ur: NEGATIVE

## 2013-12-16 LAB — POCT URINALYSIS DIPSTICK
BILIRUBIN UA: NEGATIVE
Glucose, UA: NEGATIVE
Ketones, UA: NEGATIVE
Leukocytes, UA: NEGATIVE
NITRITE UA: NEGATIVE
PH UA: 7.5
PROTEIN UA: NEGATIVE
Spec Grav, UA: <=1.005
Urobilinogen, UA: 0.2

## 2013-12-16 MED ORDER — SULFAMETHOXAZOLE-TMP DS 800-160 MG PO TABS: 1.0000 | ORAL_TABLET | Freq: Two times a day (BID) | ORAL | Status: DC

## 2013-12-16 NOTE — Assessment & Plan Note (Signed)
New.  Pt w/ diffuse abdominal pain but greatest over the bladder.  Given hematuria and other urinary complaints, suspect UTI but will get labs to r/o other cause.  Also due to pt's vaginal spotting, recommended GYN work up.  Pt agreeable to this.  Will follow.

## 2013-12-16 NOTE — Assessment & Plan Note (Signed)
New.  Due to pt's frequency, urgency, suprapubic tenderness and abnormal urine odor, I suspect she has a UTI.  Will send urine for cx and start abx.  Reviewed supportive care and red flags that should prompt return.  Pt expressed understanding and is in agreement w/ plan.

## 2013-12-16 NOTE — Patient Instructions (Signed)
Follow up as needed Start the Bactrim twice daily for presumed UTI We'll notify you of your lab results We'll call you with your GYN appt Drink plenty of fluids Call with any questions or concerns Hang in there!!

## 2013-12-16 NOTE — Progress Notes (Signed)
Pre visit review using our clinic review tool, if applicable. No additional management support is needed unless otherwise documented below in the visit note. 

## 2013-12-16 NOTE — Progress Notes (Signed)
   Subjective:    Patient ID: Angela Evans, female    DOB: 01-14-1971, 43 y.o.   MRN: [id number]  HPI abd bloating, dark urine, malodorous urine.  Increased frequency.  No dysuria.  + urgency.  Recent stress incontinence. sxs started 'a couple of months ago'.  + suprapubic pressure/pain.  Vaginal spotting x2 weeks.  Pt reports vaginal bleeding 'every other week'.  Periods are shorter but more frequent.  Does not have GYN.  Pt has hx of heavy periods but is unable to take OCPs due to smoking.     Review of Systems For ROS see HPI     Objective:   Physical Exam  Vitals reviewed. Constitutional: She is oriented to person, place, and time. She appears well-developed and well-nourished. No distress.  HENT:  Head: Normocephalic and atraumatic.  Neck: Normal range of motion. Neck supple. No thyromegaly present.  Cardiovascular: Normal rate, regular rhythm, normal heart sounds and intact distal pulses.   Pulmonary/Chest: Effort normal and breath sounds normal. No respiratory distress. She has no wheezes. She has no rales.  Abdominal: Soft. Bowel sounds are normal. She exhibits no distension and no mass. There is tenderness (diffuse abdominal tenderness, greatest over bladder). There is no rebound and no guarding.  Genitourinary:  Pt declined vaginal exam  Lymphadenopathy:    She has no cervical adenopathy.  Neurological: She is alert and oriented to person, place, and time.  Skin: Skin is warm and dry.  Psychiatric: She has a normal mood and affect. Her behavior is normal. Thought content normal.          Assessment & Plan:

## 2013-12-16 NOTE — Assessment & Plan Note (Signed)
Pt w/ hx of heavy menses but now having frequent and irregular periods.  Pregnancy ruled out today.  Unable to take OCPs to regulate due to smoking.  Pt refused exam today.  Will refer to GYN for evaluation and tx.  Pt expressed understanding and is in agreement w/ plan.

## 2013-12-17 ENCOUNTER — Telehealth: Payer: Self-pay | Admitting: Family Medicine

## 2013-12-17 NOTE — Telephone Encounter (Signed)
emmi emailed °

## 2013-12-19 LAB — URINE CULTURE

## 2014-01-09 ENCOUNTER — Encounter: Payer: Self-pay | Admitting: Family Medicine

## 2014-01-09 MED ORDER — TRIAMCINOLONE ACETONIDE 0.1 % EX OINT: 1.0000 "application " | TOPICAL_OINTMENT | Freq: Two times a day (BID) | CUTANEOUS | Status: DC

## 2014-01-09 MED ORDER — PREDNISONE 10 MG PO TABS: ORAL_TABLET | ORAL | Status: DC

## 2014-01-19 ENCOUNTER — Other Ambulatory Visit: Payer: Self-pay | Admitting: Obstetrics & Gynecology

## 2014-01-20 LAB — CYTOLOGY - PAP

## 2014-02-14 ENCOUNTER — Other Ambulatory Visit: Payer: Self-pay | Admitting: Family Medicine

## 2014-02-16 NOTE — Telephone Encounter (Signed)
Med filled.  

## 2014-05-14 ENCOUNTER — Encounter: Payer: Self-pay | Admitting: Family Medicine

## 2014-06-12 ENCOUNTER — Other Ambulatory Visit: Payer: Self-pay | Admitting: Family Medicine

## 2014-08-06 IMAGING — MG MM DIGITAL SCREENING BILAT W/ CAD
4 series · 4 of 4 positions shown · non-contrast
Comparison: None.

CLINICAL DATA: Screening.

EXAM:
DIGITAL SCREENING BILATERAL MAMMOGRAM WITH CAD

[R CC]
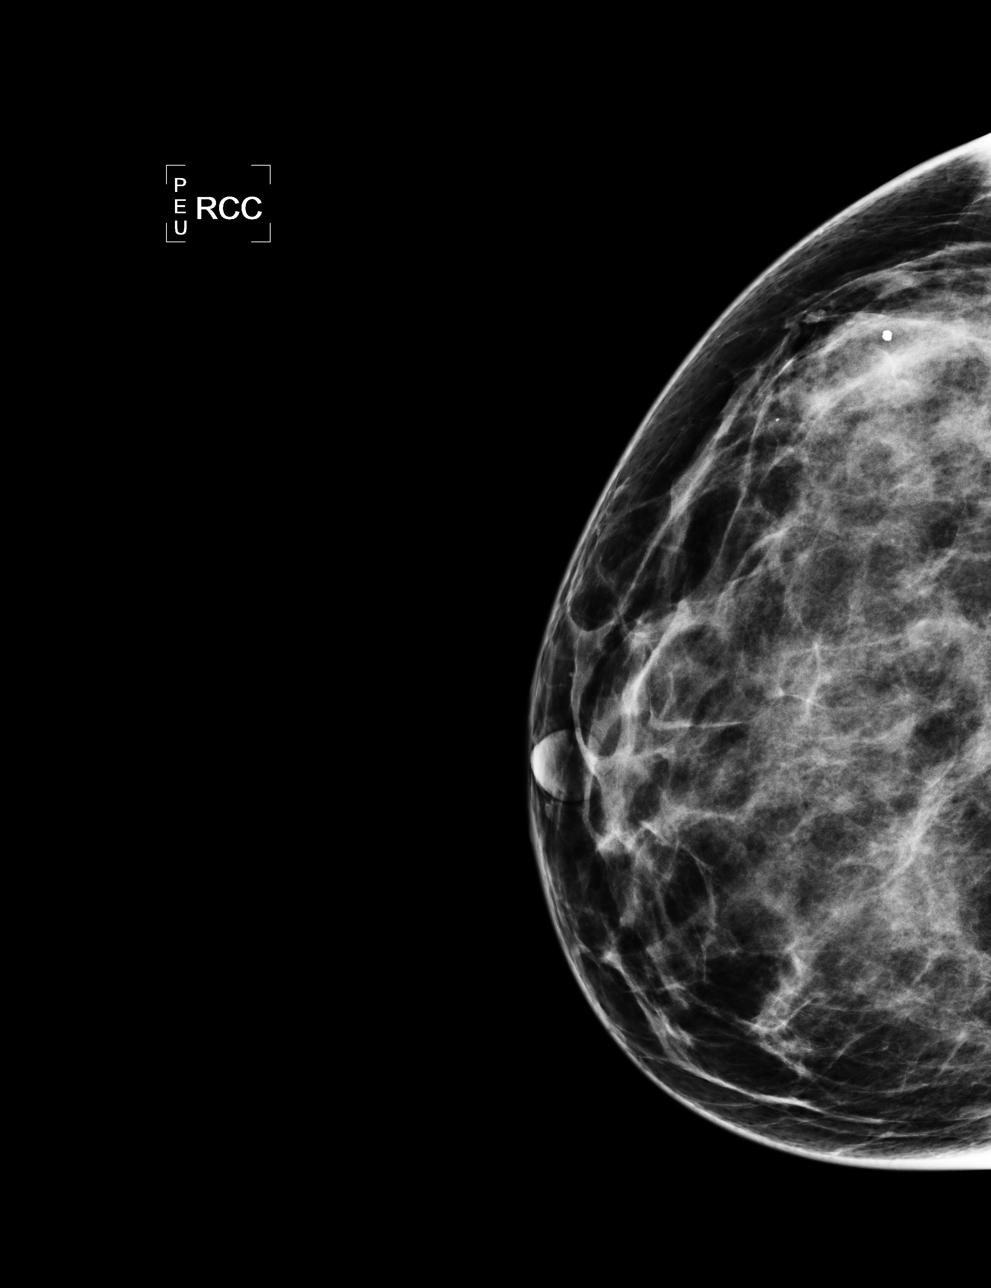

[L CC]
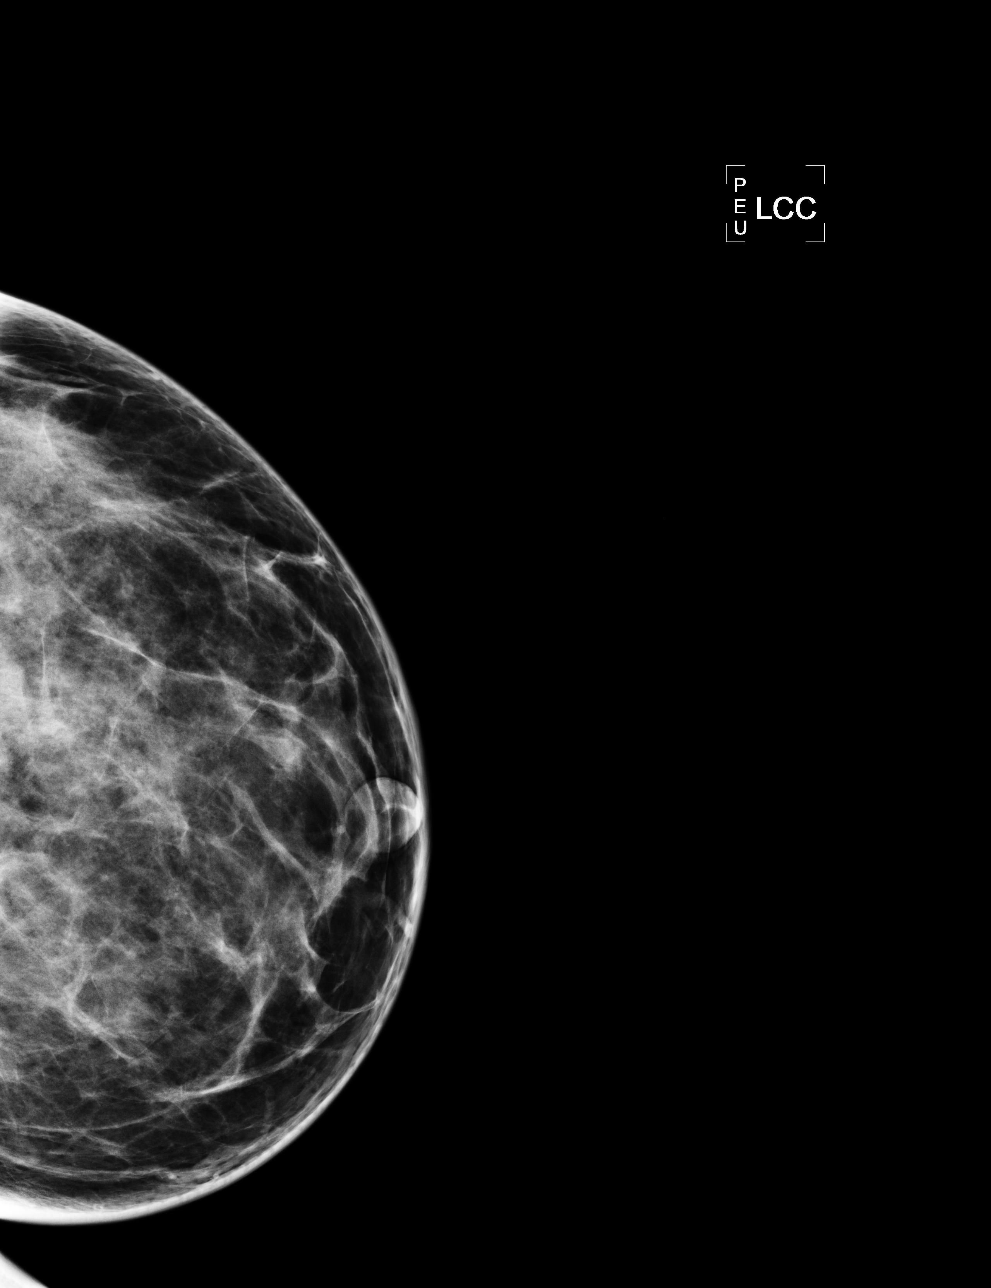

[L MLO]
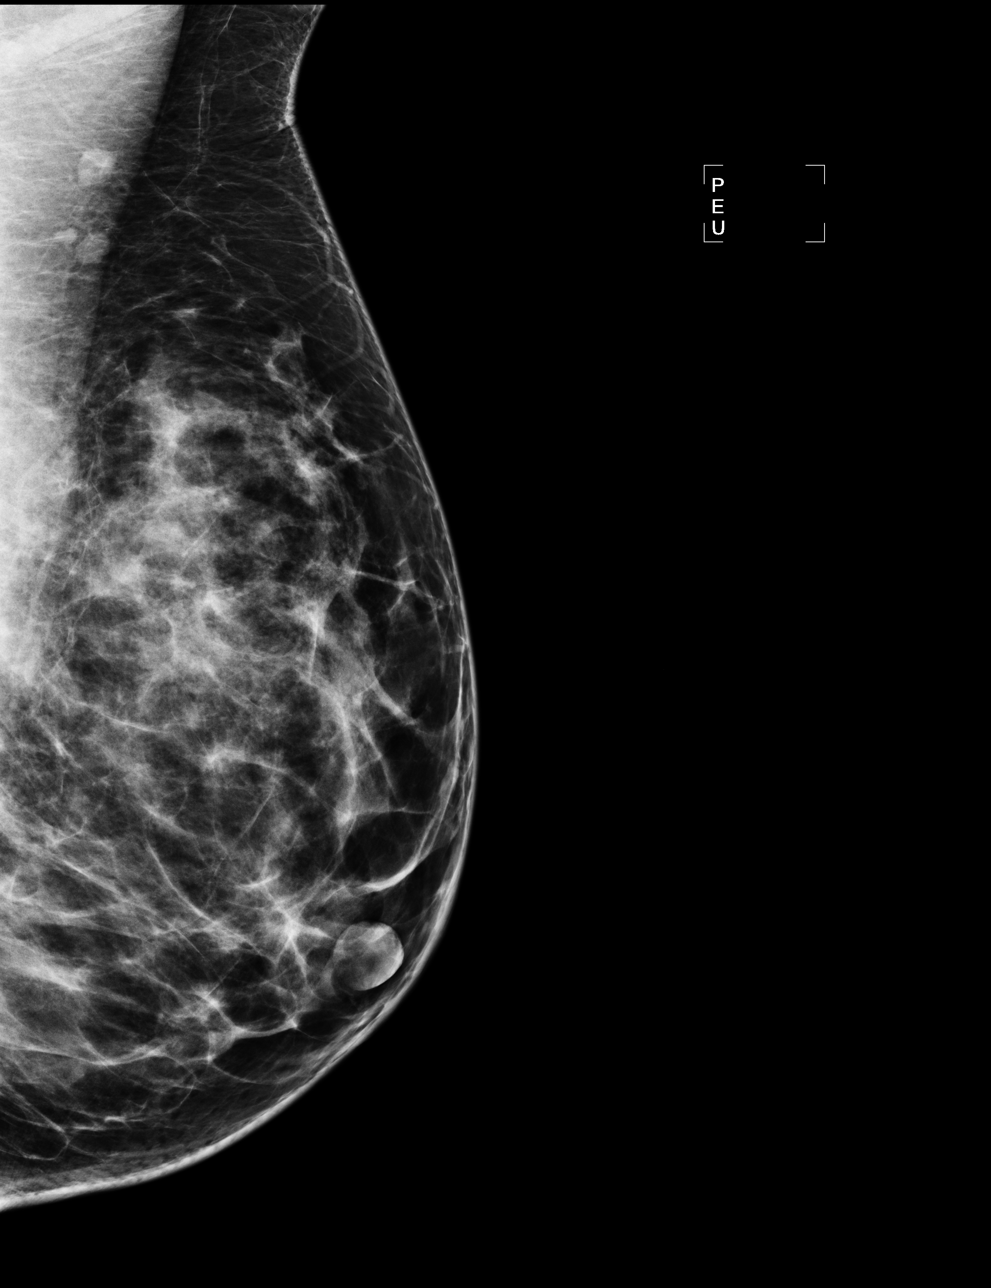

[R MLO]
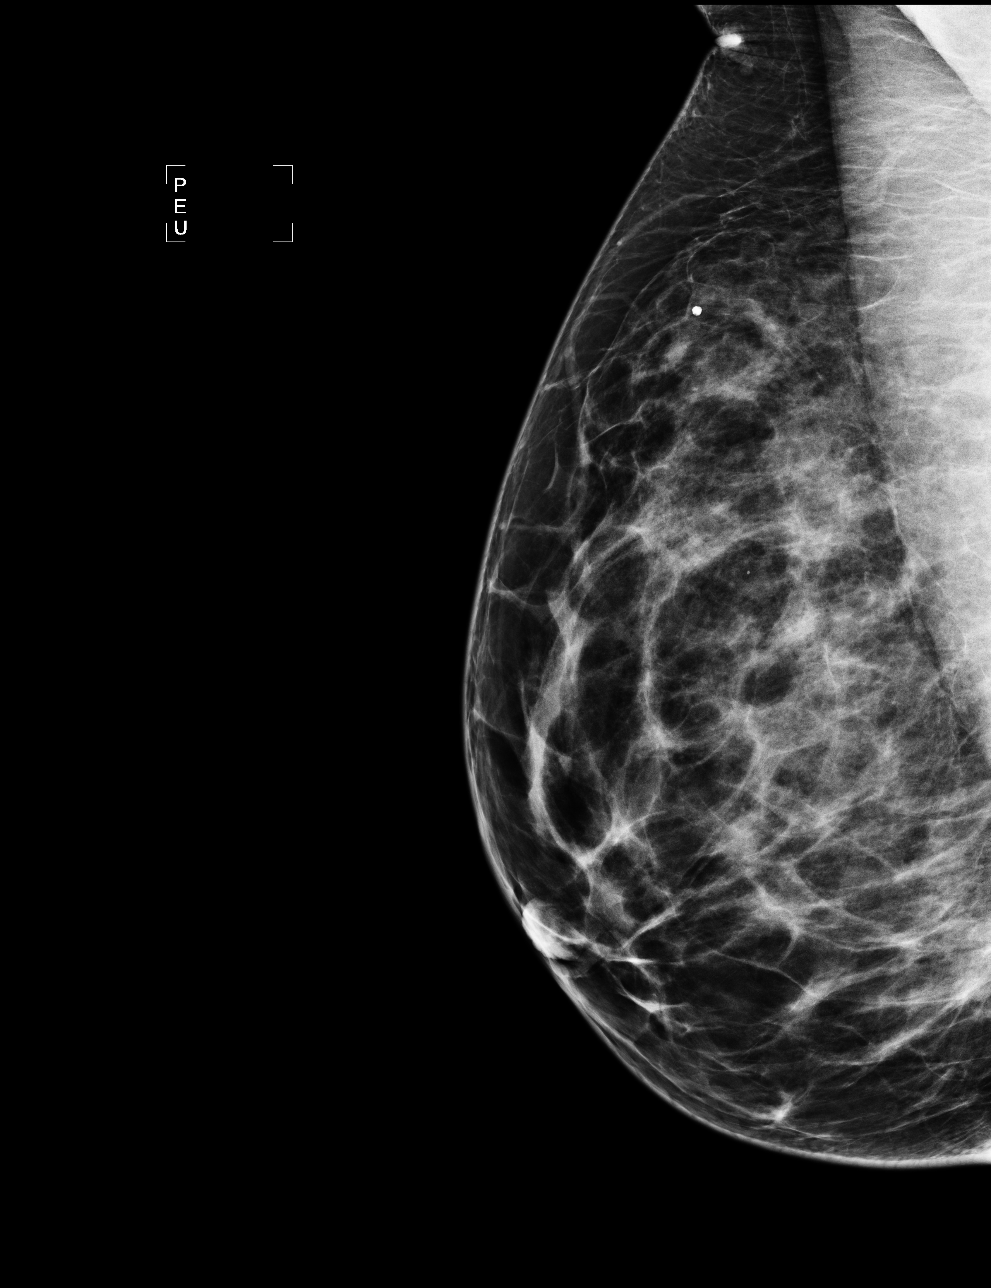

[4 of 4 positions shown; findings below may reference images not displayed]

ACR Breast Density Category c: The breasts are heterogeneously
dense, which may obscure small masses
FINDINGS: There are no findings suspicious for malignancy. Images were
processed with CAD.
IMPRESSION: No mammographic evidence of malignancy. A result letter of this
screening mammogram will be mailed directly to the patient.

RECOMMENDATION:
Screening mammogram in one year. (Code:I9-L-X06)

BI-RADS CATEGORY  1: Negative

## 2014-08-25 ENCOUNTER — Encounter: Payer: Self-pay | Admitting: Medical

## 2014-08-25 ENCOUNTER — Ambulatory Visit (INDEPENDENT_AMBULATORY_CARE_PROVIDER_SITE_OTHER): Payer: 59 | Admitting: Medical

## 2014-08-25 VITALS — BP 128/82 | HR 76 | Temp 97.9°F | Ht 69.0 in | Wt 207.4 lb

## 2014-08-25 DIAGNOSIS — T7840XA Allergy, unspecified, initial encounter: Secondary | ICD-10-CM

## 2014-08-25 DIAGNOSIS — M545 Low back pain, unspecified: Secondary | ICD-10-CM

## 2014-08-25 DIAGNOSIS — M549 Dorsalgia, unspecified: Secondary | ICD-10-CM | POA: Insufficient documentation

## 2014-08-25 LAB — POCT URINALYSIS DIPSTICK
BILIRUBIN UA: NEGATIVE
Glucose, UA: NEGATIVE
KETONES UA: NEGATIVE
Leukocytes, UA: NEGATIVE
Nitrite, UA: NEGATIVE
PH UA: 6
Protein, UA: NEGATIVE
RBC UA: NEGATIVE
UROBILINOGEN UA: 4

## 2014-08-25 MED ORDER — DOXYCYCLINE HYCLATE 100 MG PO TABS: 100.0000 mg | ORAL_TABLET | Freq: Two times a day (BID) | ORAL | Status: DC

## 2014-08-25 MED ORDER — HYDROXYZINE HCL 25 MG PO TABS: 25.0000 mg | ORAL_TABLET | Freq: Three times a day (TID) | ORAL | Status: DC | PRN

## 2014-08-25 MED ORDER — METHYLPREDNISOLONE ACETATE 40 MG/ML IJ SUSP
40.0000 mg | Freq: Once | INTRAMUSCULAR | Status: AC
  Administered 2014-08-25: 40 mg via INTRAMUSCULAR

## 2014-08-25 MED ORDER — PREDNISONE 20 MG PO TABS: ORAL_TABLET | ORAL | Status: DC

## 2014-08-25 MED ORDER — METHYLPREDNISOLONE ACETATE 40 MG/ML IJ SUSP: 40.0000 mg | Freq: Once | INTRAMUSCULAR | Status: DC

## 2014-08-25 NOTE — Patient Instructions (Addendum)
Allergic reaction Possible to exposure to poison ivy by hx. Not sure if hcg shots play role in this. But would advise take break from injection over next 10 days.  Depomedrol 40 mg im. 3 days taper of prednisone. Hydroxyzine for itching.  Your rt forearm mild swollen compared to lt side. Maybe secondary infection from itching. If warm, tender, yellow dc from scratch area, or swelling then start doxycycline(make sure you have food in stomach in event take doxy).   Back pain Treatment of depmedrol taper prednisone should help for back as well.    After you finish taper prednisone can take alleve otc twice daily. Back stretch exercises. If pain persist can refer to PT.  Follow in 10 days or as needed(persisting or worsening signs or symptoms)

## 2014-08-25 NOTE — Progress Notes (Signed)
Pre visit review using our clinic review tool, if applicable. No additional management support is needed unless otherwise documented below in the visit note. 

## 2014-08-25 NOTE — Assessment & Plan Note (Addendum)
Possible to exposure to poison ivy by hx. Not sure if hcg shots play role in this. But would advise take break from injection over next 10 days.  Depomedrol 40 mg im. 3 days taper of prednisone. Hydroxyzine for itching.  Your rt forearm mild swollen compared to lt side. Maybe secondary infection from itching. If warm, tender, yellow dc from scratch area, or swelling then start doxycycline(make sure you have food in stomach in event take doxy).

## 2014-08-25 NOTE — Progress Notes (Signed)
Subjective:    Patient ID: Angela Evans, female    DOB: August 27, 1970, 44 y.o.   MRN: [id number]  HPI  Pt has in with low back pain for over a week. About 8 days. Pt states just woke up and felt mild pain. No hx of back pain. No fall, no trauma, and no heavy work.   Pain in her back comes and goes. This morning initially not bad but last couple of hours mild worsens.  No pain shooting down her legs. No saddle anesthesia. No leg weakness. Tried advil and it did help some.  Pain level increase with movment 5-6/10.  Also 2 days ago has upper chest itching with itching to her arms and legs. Pt recently started hcg shots. Has been on a couple of weeks. Doing this in attempt to loose weight.  Pt was pulling on vine on a tree on Saturday so some possibility of poison ivy contact.  NO sob or wheezing on and off.  Some watery eyes and transient blurred vision with rash.   LMP- 2 wks ago.    Review of Systems  Constitutional: Negative for fever, chills and fatigue.  Respiratory: Negative for cough, chest tightness and wheezing.   Cardiovascular: Negative for chest pain and palpitations.  Gastrointestinal: Negative for nausea, vomiting and abdominal pain.  Genitourinary: Negative for dysuria, urgency, hematuria and flank pain.  Musculoskeletal: Positive for back pain.  Skin: Positive for rash.       Itches.  Neurological: Negative for weakness and numbness.   Past Medical History  Diagnosis Date  . Depression     History   Social History  . Marital Status: Single    Spouse Name: N/A  . Number of Children: 1  . Years of Education: N/A   Occupational History  . Not on file.   Social History Main Topics  . Smoking status: Current Every Day Smoker  . Smokeless tobacco: Not on file  . Alcohol Use: Not on file  . Drug Use: Not on file  . Sexual Activity: Not on file   Other Topics Concern  . Not on file   Social History Narrative    No past surgical history on  file.  Family History  Problem Relation Age of Onset  . Coronary artery disease Father   . Hypertension Father   . Diabetes Neg Hx   . Cancer Neg Hx   . Stroke Neg Hx     Allergies  Allergen Reactions  . Penicillins     Current Outpatient Prescriptions on File Prior to Visit  Medication Sig Dispense Refill  . venlafaxine XR (EFFEXOR-XR) 150 MG 24 hr capsule TAKE 1 CAPSULE ONCE A DAY WITH BREAKFAST. 30 capsule 2  . triamcinolone ointment (KENALOG) 0.1 % Apply 1 application topically 2 (two) times daily. 90 g 1  . varenicline (CHANTIX CONTINUING MONTH PAK) 1 MG tablet Take 1 tablet (1 mg total) by mouth 2 (two) times daily. (Patient not taking: Reported on 08/25/2014) 60 tablet 2  . varenicline (CHANTIX STARTING MONTH PAK) 0.5 MG X 11 & 1 MG X 42 tablet Take 0.5 mg tablet daily for 3 days, then one 0.5 mg tablet twice daily for 4 days, then one 1 mg tablet twice daily. (Patient not taking: Reported on 08/25/2014) 53 tablet 0   No current facility-administered medications on file prior to visit.    BP 128/82 mmHg  Pulse 76  Temp(Src) 97.9 F (36.6 C) (Oral)  Ht 5\' 9"  (1.753  m)  Wt 207 lb 6.4 oz (94.076 kg)  BMI 30.61 kg/m2  SpO2 99%  LMP 08/11/2014       Objective:   Physical Exam  General Appearance- Not in acute distress.    Chest and Lung Exam Auscultation: Breath sounds:-Normal. Clear even and unlabored. Adventitious sounds:- No Adventitious sounds.  Cardiovascular Auscultation:Rythm - Regular, rate and rythm. Heart Sounds -Normal heart sounds.  Abdomen Inspection:-Inspection Normal.  Palpation/Perucssion: Palpation and Percussion of the abdomen reveal- Non Tender, No Rebound tenderness, No rigidity(Guarding) and No Palpable abdominal masses.  Liver:-Normal.  Spleen:- Normal.   Back No mid  lumbar spine tenderness to palpation.(but lwoer lumbar paraspinal tender on there rt) Pain on straight leg lift. Pain on lateral movements and flexion/extension of the  spine. No cva tenderness  Derm- scatterered red rash upper chest lower legs and arm. Scratch marks rt forearm. Appears mild more swollen rt side than lt but not tender to touch. NO dc.      Assessment & Plan:

## 2014-08-25 NOTE — Assessment & Plan Note (Signed)
Treatment of depmedrol taper prednisone should help for back as well.

## 2014-09-11 ENCOUNTER — Other Ambulatory Visit: Payer: Self-pay | Admitting: Family Medicine

## 2014-09-11 NOTE — Telephone Encounter (Signed)
Letter mailed to pt to inform of need for appt with Tabori. No further refills.

## 2014-09-18 ENCOUNTER — Encounter: Payer: Self-pay | Admitting: Family Medicine

## 2014-09-18 ENCOUNTER — Ambulatory Visit (INDEPENDENT_AMBULATORY_CARE_PROVIDER_SITE_OTHER): Payer: Managed Care, Other (non HMO) | Admitting: Family Medicine

## 2014-09-18 VITALS — BP 126/80 | HR 76 | Temp 98.0°F | Resp 16 | Ht 69.0 in | Wt 200.0 lb

## 2014-09-18 DIAGNOSIS — F329 Major depressive disorder, single episode, unspecified: Secondary | ICD-10-CM

## 2014-09-18 DIAGNOSIS — F32A Depression, unspecified: Secondary | ICD-10-CM

## 2014-09-18 MED ORDER — VENLAFAXINE HCL ER 37.5 MG PO CP24: 37.5000 mg | ORAL_CAPSULE | Freq: Every day | ORAL | Status: DC

## 2014-09-18 MED ORDER — VENLAFAXINE HCL ER 75 MG PO CP24: 75.0000 mg | ORAL_CAPSULE | Freq: Every day | ORAL | Status: DC

## 2014-09-18 MED ORDER — BUPROPION HCL ER (XL) 150 MG PO TB24: 150.0000 mg | ORAL_TABLET | Freq: Every day | ORAL | Status: DC

## 2014-09-18 NOTE — Progress Notes (Signed)
Pre visit review using our clinic review tool, if applicable. No additional management support is needed unless otherwise documented below in the visit note. 

## 2014-09-18 NOTE — Assessment & Plan Note (Signed)
Ongoing issue for pt.  Interested in switching meds due to withdrawal effects when she forgets a pill.  Wants to switch to Wellbutrin as she does not want to gain weight.  Outlined weaning plan along w/ initiation of Wellbutrin in pt instructions.  Reviewed possible side effects of weaning medication- dizziness, nausea, HAs, visual changes.  Pt aware and is to call if needed.  Pt expressed understanding and is in agreement w/ plan.

## 2014-09-18 NOTE — Patient Instructions (Signed)
Schedule complete physical in 3 months (we'll reassess meds at that time) If you need me sooner- or at any time during this med change process- let me know! After you complete this month of Effexor, decrease to 75mg  daily x1 month.  After that month, decrease to 37.5mg  daily x2 weeks and then every other day x2 weeks and then stop. Start the Wellbutrin once you have decreased to the 37.5mg  Effexor (approx late August) If for whatever reason, this is not working, let me know! Call with any questions or concerns Happy 4th of July!!!

## 2014-09-18 NOTE — Progress Notes (Signed)
   Subjective:    Patient ID: Angela Evans, female    DOB: 06/18/1970, 44 y.o.   MRN: [id number]  HPI Depression- chronic problem, well controlled on Effexor but pt would like to wean off due to side effects- dizziness and blurry vision if she misses a pill.  Pt would like to switch to Wellbutrin to control depression.  Wants to know how to make change.   Review of Systems For ROS see HPI     Objective:   Physical Exam  Constitutional: She is oriented to person, place, and time. She appears well-developed and well-nourished. No distress.  HENT:  Head: Normocephalic and atraumatic.  Eyes: Conjunctivae and EOM are normal. Pupils are equal, round, and reactive to light.  Pulmonary/Chest: Effort normal. No respiratory distress.  Musculoskeletal: She exhibits no edema.  Neurological: She is alert and oriented to person, place, and time.  Skin: Skin is warm and dry.  Psychiatric: She has a normal mood and affect. Her behavior is normal. Thought content normal.  Vitals reviewed.         Assessment & Plan:

## 2014-11-13 ENCOUNTER — Encounter: Payer: Self-pay | Admitting: Family Medicine

## 2014-11-16 MED ORDER — VENLAFAXINE HCL ER 75 MG PO CP24: 75.0000 mg | ORAL_CAPSULE | Freq: Every day | ORAL | Status: DC

## 2014-11-16 NOTE — Telephone Encounter (Signed)
75mg  XR effexor filled, will increase to 150mg  XR in one month if pt needs to.

## 2014-12-16 ENCOUNTER — Other Ambulatory Visit: Payer: Self-pay | Admitting: Family Medicine

## 2014-12-16 NOTE — Telephone Encounter (Signed)
Medication filled to pharmacy as requested.   

## 2014-12-30 ENCOUNTER — Encounter: Payer: Self-pay | Admitting: Family Medicine

## 2014-12-30 ENCOUNTER — Ambulatory Visit (INDEPENDENT_AMBULATORY_CARE_PROVIDER_SITE_OTHER): Payer: Managed Care, Other (non HMO) | Admitting: Family Medicine

## 2014-12-30 VITALS — BP 124/86 | HR 86 | Temp 98.1°F | Resp 16 | Wt 206.2 lb

## 2014-12-30 DIAGNOSIS — R21 Rash and other nonspecific skin eruption: Secondary | ICD-10-CM

## 2014-12-30 DIAGNOSIS — S29011A Strain of muscle and tendon of front wall of thorax, initial encounter: Secondary | ICD-10-CM | POA: Diagnosis not present

## 2014-12-30 MED ORDER — TRIAMCINOLONE ACETONIDE 0.1 % EX OINT: 1.0000 "application " | TOPICAL_OINTMENT | Freq: Two times a day (BID) | CUTANEOUS | Status: DC

## 2014-12-30 NOTE — Assessment & Plan Note (Signed)
New.  Unclear as to cause of pt's rash.  Given that it has been present for months and worsening, will refer to derm for complete evaluation and tx.  Will start topical triamcinolone ointment BID.  Reviewed supportive care and red flags that should prompt return.  Pt expressed understanding and is in agreement w/ plan.

## 2014-12-30 NOTE — Progress Notes (Signed)
   Subjective:    Patient ID: Angela Evans, female    DOB: 1970-07-16, 44 y.o.   MRN: [id number]  HPI Rash- pt reports break out on chest, upper arms, now traveling up neck.  Not itchy.  Not painful.  She had rash all summer and thought it was heat related but they have not resolved in cooler weather.  L chest pain- intermittent, pain worsens w/ arm movement.  sxs started 'a couple of days ago'.  TTP over L upper chest.  No changes in activity level recently.  Pt does a lot of driving w/ L arm.  Pain improved today.  Pain w/ reaching out to side.   Review of Systems For ROS see HPI     Objective:   Physical Exam  Constitutional: She is oriented to person, place, and time. She appears well-developed and well-nourished. No distress.  HENT:  Head: Normocephalic and atraumatic.  Cardiovascular: Normal rate, regular rhythm and normal heart sounds.   Pulmonary/Chest: Effort normal and breath sounds normal. No respiratory distress. She has no wheezes. She exhibits tenderness (TTP over L pectoralis origin, pain w/ abduction of L arm).  Neurological: She is alert and oriented to person, place, and time.  Skin: Skin is warm and dry. Rash (maculopapular rash on chest and bilateral upper arms- not painful, not itchy) noted. There is erythema (redness of chest and upper arms surrounding rash).  Vitals reviewed.         Assessment & Plan:

## 2014-12-30 NOTE — Progress Notes (Signed)
Pre visit review using our clinic review tool, if applicable. No additional management support is needed unless otherwise documented below in the visit note. 

## 2014-12-30 NOTE — Assessment & Plan Note (Signed)
New.  Pt's pain and PE consistent w/ musculoskeletal etiology and not cardiac.  Start scheduled NSAIDs, heating pad.  Reviewed supportive care and red flags that should prompt return.  Pt expressed understanding and is in agreement w/ plan.

## 2014-12-30 NOTE — Patient Instructions (Signed)
Follow up as scheduled Apply the Triamcinolone ointment twice daily to spots We'll call you with your derm appt Heating pad and ibuprofen for muscle strain- let me know if symptoms change or worsen Call with any questions or concerns Happy Fall!!!!

## 2015-02-04 ENCOUNTER — Encounter: Payer: Managed Care, Other (non HMO) | Admitting: Family Medicine

## 2015-03-16 ENCOUNTER — Encounter: Payer: Self-pay | Admitting: Family Medicine

## 2015-03-17 MED ORDER — VENLAFAXINE HCL ER 150 MG PO CP24: 150.0000 mg | ORAL_CAPSULE | Freq: Every day | ORAL | Status: DC

## 2015-03-17 NOTE — Telephone Encounter (Signed)
Medication filled to pharmacy as requested.   

## 2015-04-23 ENCOUNTER — Encounter: Payer: Self-pay | Admitting: Behavioral Health

## 2015-04-23 ENCOUNTER — Telehealth: Payer: Self-pay | Admitting: Behavioral Health

## 2015-04-23 NOTE — Telephone Encounter (Signed)
Pre-Visit Call completed with patient and chart updated.   Pre-Visit Info documented in Specialty Comments under [employer].    

## 2015-04-26 ENCOUNTER — Telehealth: Payer: Self-pay | Admitting: Family Medicine

## 2015-04-26 ENCOUNTER — Encounter: Payer: Managed Care, Other (non HMO) | Admitting: Family Medicine

## 2015-04-27 NOTE — Telephone Encounter (Signed)
Pt wa no show 04/26/15 3:30pm for cpe appt, per chart pt called in and rescheduled for 06/25/15 at 10:59am 04/26/15, Langley Gauss do you remember why pt had to reschedule/no show?

## 2015-04-28 NOTE — Telephone Encounter (Signed)
I need to know if there was a reason before I can decide on no-show fee

## 2015-04-29 NOTE — Telephone Encounter (Signed)
I spoke with Angela Evans and she is unable to recall reason for cancellation.

## 2015-04-29 NOTE — Telephone Encounter (Signed)
No charge but there will be for next no-show/late cancellation

## 2015-06-25 ENCOUNTER — Encounter: Payer: Self-pay | Admitting: Family Medicine

## 2015-06-25 ENCOUNTER — Ambulatory Visit (INDEPENDENT_AMBULATORY_CARE_PROVIDER_SITE_OTHER): Payer: Managed Care, Other (non HMO) | Admitting: Family Medicine

## 2015-06-25 VITALS — BP 122/82 | HR 89 | Temp 98.4°F | Resp 16 | Ht 69.0 in | Wt 199.4 lb

## 2015-06-25 DIAGNOSIS — F17200 Nicotine dependence, unspecified, uncomplicated: Secondary | ICD-10-CM

## 2015-06-25 DIAGNOSIS — Z23 Encounter for immunization: Secondary | ICD-10-CM | POA: Diagnosis not present

## 2015-06-25 DIAGNOSIS — Z0000 Encounter for general adult medical examination without abnormal findings: Secondary | ICD-10-CM

## 2015-06-25 LAB — CBC WITH DIFFERENTIAL/PLATELET
BASOS ABS: 0.1 10*3/uL (ref 0.0–0.1)
Basophils Relative: 0.8 % (ref 0.0–3.0)
EOS ABS: 0.2 10*3/uL (ref 0.0–0.7)
EOS PCT: 2.5 % (ref 0.0–5.0)
HCT: 39.2 % (ref 36.0–46.0)
Hemoglobin: 13.2 g/dL (ref 12.0–15.0)
LYMPHS ABS: 2.7 10*3/uL (ref 0.7–4.0)
Lymphocytes Relative: 30.2 % (ref 12.0–46.0)
MCHC: 33.7 g/dL (ref 30.0–36.0)
MCV: 85.5 fl (ref 78.0–100.0)
MONO ABS: 0.6 10*3/uL (ref 0.1–1.0)
Monocytes Relative: 7 % (ref 3.0–12.0)
NEUTROS PCT: 59.5 % (ref 43.0–77.0)
Neutro Abs: 5.2 10*3/uL (ref 1.4–7.7)
Platelets: 432 10*3/uL — ABNORMAL HIGH (ref 150.0–400.0)
RBC: 4.59 Mil/uL (ref 3.87–5.11)
RDW: 13.6 % (ref 11.5–15.5)
WBC: 8.8 10*3/uL (ref 4.0–10.5)

## 2015-06-25 LAB — BASIC METABOLIC PANEL
BUN: 11 mg/dL (ref 6–23)
CALCIUM: 9.5 mg/dL (ref 8.4–10.5)
CO2: 26 meq/L (ref 19–32)
Chloride: 103 mEq/L (ref 96–112)
Creatinine, Ser: 0.95 mg/dL (ref 0.40–1.20)
GFR: 68.19 mL/min (ref >60.00)
GLUCOSE: 74 mg/dL (ref 70–99)
Potassium: 3.6 mEq/L (ref 3.5–5.1)
Sodium: 136 mEq/L (ref 135–145)

## 2015-06-25 LAB — VITAMIN D 25 HYDROXY (VIT D DEFICIENCY, FRACTURES): VITD: 14.79 ng/mL — AB (ref 30.00–100.00)

## 2015-06-25 LAB — TSH: TSH: 1.9 u[IU]/mL (ref 0.35–4.50)

## 2015-06-25 LAB — LIPID PANEL
CHOLESTEROL: 186 mg/dL (ref 0–200)
HDL: 41 mg/dL (ref >39.00)
LDL Cholesterol: 124 mg/dL — ABNORMAL HIGH (ref 0–99)
NonHDL: 145.13
Total CHOL/HDL Ratio: 5
Triglycerides: 106 mg/dL (ref 0.0–149.0)
VLDL: 21.2 mg/dL (ref 0.0–40.0)

## 2015-06-25 LAB — HEPATIC FUNCTION PANEL
ALK PHOS: 104 U/L (ref 39–117)
ALT: 14 U/L (ref 0–35)
AST: 15 U/L (ref 0–37)
Albumin: 4.2 g/dL (ref 3.5–5.2)
Bilirubin, Direct: 0 mg/dL (ref 0.0–0.3)
TOTAL PROTEIN: 7.1 g/dL (ref 6.0–8.3)
Total Bilirubin: 0.2 mg/dL (ref 0.2–1.2)

## 2015-06-25 MED ORDER — VARENICLINE TARTRATE 0.5 MG X 11 & 1 MG X 42 PO MISC: ORAL | Status: DC

## 2015-06-25 NOTE — Progress Notes (Signed)
   Subjective:    Patient ID: Angela Evans, female    DOB: July 01, 1970, 45 y.o.   MRN: [id number]  HPI CPE- UTD on pap, due for mammo (plans to call [facility]).  Due Tdap.  No concerns today.     Review of Systems Patient reports no vision/ hearing changes, adenopathy,fever, weight change,  persistant/recurrent hoarseness , swallowing issues, chest pain, palpitations, edema, persistant/recurrent cough, hemoptysis, dyspnea (rest/exertional/paroxysmal nocturnal), gastrointestinal bleeding (melena, rectal bleeding), abdominal pain, significant heartburn, bowel changes, GU symptoms (dysuria, hematuria, incontinence), Gyn symptoms (abnormal  bleeding, pain),  syncope, focal weakness, memory loss, numbness & tingling, skin/hair/nail changes, abnormal bruising or bleeding, anxiety, or depression.     Objective:   Physical Exam General Appearance:    Alert, cooperative, no distress, appears stated age  Head:    Normocephalic, without obvious abnormality, atraumatic  Eyes:    PERRL, conjunctiva/corneas clear, EOM's intact, fundi    benign, both eyes  Ears:    Normal TM's and external ear canals, both ears  Nose:   Nares normal, septum midline, mucosa normal, no drainage    or sinus tenderness  Throat:   Lips, mucosa, and tongue normal; teeth and gums normal  Neck:   Supple, symmetrical, trachea midline, no adenopathy;    Thyroid: no enlargement/tenderness/nodules  Back:     Symmetric, no curvature, ROM normal, no CVA tenderness  Lungs:     Clear to auscultation bilaterally, respirations unlabored  Chest Wall:    No tenderness or deformity   Heart:    Regular rate and rhythm, S1 and S2 normal, no murmur, rub   or gallop  Breast Exam:    Deferred to GYN  Abdomen:     Soft, non-tender, bowel sounds active all four quadrants,    no masses, no organomegaly  Genitalia:    Deferred to GYN  Rectal:    Extremities:   Extremities normal, atraumatic, no cyanosis or edema  Pulses:   2+  and symmetric all extremities  Skin:   Skin color, texture, turgor normal, no rashes or lesions  Lymph nodes:   Cervical, supraclavicular, and axillary nodes normal  Neurologic:   CNII-XII intact, normal strength, sensation and reflexes    throughout          Assessment & Plan:

## 2015-06-25 NOTE — Assessment & Plan Note (Signed)
Pt's PE WNL.  UTD on pap.  Due for mammo- pt plans to schedule.  Check labs.  Tdap updated.  Anticipatory guidance provided.

## 2015-06-25 NOTE — Patient Instructions (Signed)
Follow up in 1 year or as needed We'll notify you of your lab results and make any changes if needed Keep up the good work on healthy diet and regular exercise- you look great!! Call and schedule your mammogram Call with any questions or concerns Thanks for sticking with [place]! Happy Spring!!!

## 2015-06-25 NOTE — Addendum Note (Signed)
Addended by: Davis Gourd on: 06/25/2015 09:57 AM   Modules accepted: Orders

## 2015-06-25 NOTE — Progress Notes (Signed)
Pre visit review using our clinic review tool, if applicable. No additional management support is needed unless otherwise documented below in the visit note. 

## 2015-06-25 NOTE — Assessment & Plan Note (Signed)
Pt is willing to attempt the Chantix- she never filled the previous prescription

## 2015-06-28 ENCOUNTER — Other Ambulatory Visit: Payer: Self-pay | Admitting: General Practice

## 2015-06-28 MED ORDER — VITAMIN D (ERGOCALCIFEROL) 1.25 MG (50000 UNIT) PO CAPS: 50000.0000 [IU] | ORAL_CAPSULE | ORAL | Status: DC

## 2015-06-29 ENCOUNTER — Encounter: Payer: Self-pay | Admitting: Family Medicine

## 2015-06-29 MED ORDER — VITAMIN D (ERGOCALCIFEROL) 1.25 MG (50000 UNIT) PO CAPS: 50000.0000 [IU] | ORAL_CAPSULE | ORAL | Status: DC

## 2015-06-29 NOTE — Telephone Encounter (Signed)
Medication filled to pharmacy as requested.   

## 2015-09-03 ENCOUNTER — Ambulatory Visit: Payer: Managed Care, Other (non HMO) | Admitting: Family Medicine

## 2015-09-17 ENCOUNTER — Other Ambulatory Visit: Payer: Self-pay | Admitting: Family Medicine

## 2015-09-17 NOTE — Telephone Encounter (Signed)
Medication filled to pharmacy as requested.   

## 2016-01-10 ENCOUNTER — Other Ambulatory Visit: Payer: Self-pay | Admitting: Family Medicine

## 2016-09-12 ENCOUNTER — Other Ambulatory Visit: Payer: Self-pay | Admitting: Family Medicine

## 2016-10-31 ENCOUNTER — Ambulatory Visit: Payer: Managed Care, Other (non HMO) | Admitting: Physician Assistant

## 2016-11-01 ENCOUNTER — Other Ambulatory Visit (HOSPITAL_COMMUNITY)
Admission: RE | Admit: 2016-11-01 | Discharge: 2016-11-01 | Disposition: A | Discharge status: Home / Self Care | Payer: Managed Care, Other (non HMO) | Source: Ambulatory Visit | Attending: Family Medicine | Admitting: Family Medicine

## 2016-11-01 ENCOUNTER — Ambulatory Visit (INDEPENDENT_AMBULATORY_CARE_PROVIDER_SITE_OTHER): Payer: Managed Care, Other (non HMO) | Admitting: Family Medicine

## 2016-11-01 ENCOUNTER — Encounter: Payer: Self-pay | Admitting: Family Medicine

## 2016-11-01 VITALS — BP 124/80 | HR 90 | Temp 98.4°F | Resp 17 | Ht 69.0 in | Wt 187.0 lb

## 2016-11-01 DIAGNOSIS — R300 Dysuria: Secondary | ICD-10-CM | POA: Diagnosis not present

## 2016-11-01 DIAGNOSIS — R8290 Unspecified abnormal findings in urine: Secondary | ICD-10-CM

## 2016-11-01 DIAGNOSIS — M25511 Pain in right shoulder: Secondary | ICD-10-CM | POA: Diagnosis not present

## 2016-11-01 LAB — POCT URINALYSIS DIPSTICK
BILIRUBIN UA: NEGATIVE
Blood, UA: NEGATIVE
GLUCOSE UA: NEGATIVE
Ketones, UA: NEGATIVE
LEUKOCYTES UA: NEGATIVE
Nitrite, UA: NEGATIVE
PH UA: 6 (ref 5.0–8.0)
Protein, UA: NEGATIVE
Spec Grav, UA: 1.015 (ref 1.010–1.025)
Urobilinogen, UA: 0.2 E.U./dL

## 2016-11-01 MED ORDER — CYCLOBENZAPRINE HCL 10 MG PO TABS: 10.0000 mg | ORAL_TABLET | Freq: Every day | ORAL | 0 refills | Status: DC

## 2016-11-01 MED ORDER — IBUPROFEN 800 MG PO TABS: 800.0000 mg | ORAL_TABLET | Freq: Three times a day (TID) | ORAL | 0 refills | Status: DC | PRN

## 2016-11-01 NOTE — Progress Notes (Signed)
Pre visit review using our clinic review tool, if applicable. No additional management support is needed unless otherwise documented below in the visit note. 

## 2016-11-01 NOTE — Patient Instructions (Signed)
Follow up as scheduled or as needed We'll notify you of your urine results and make any changes if needed Use the ibuprofen up to 3x/day- take w/ food- until feeling better Use the Flexeril (cyclobenzaprine) nightly to help w/ muscle spasm Heat if needed for extra pain relief Call with any questions or concerns Enjoy the rest of your summer!!!

## 2016-11-01 NOTE — Progress Notes (Signed)
   Subjective:    Patient ID: Angela Evans, female    DOB: Nov 17, 1970, 46 y.o.   MRN: [id number]  HPI Shoulder pain- R sided, started ~1 week ago.  Was taking prescription ibuprofen left over from a crown and that helps w/ pain.  Pt is R handed.  Painful to wash hair or raise arm.  Pt reports tightness in R trap.  No change in activity or recent heavy lifting.  sxs started when she was getting a facial and lying flat on a table.  Urinary odor- no frequency, urgency or burning w/ urination.   Review of Systems For ROS see HPI     Objective:   Physical Exam  Constitutional: She is oriented to person, place, and time. She appears well-developed and well-nourished. No distress.  HENT:  Head: Normocephalic and atraumatic.  Cardiovascular: Intact distal pulses.   Musculoskeletal: Normal range of motion. She exhibits tenderness (TTP over R trap). She exhibits no edema or deformity.  Neurological: She is alert and oriented to person, place, and time.  Skin: Skin is warm and dry.  Psychiatric: She has a normal mood and affect. Her behavior is normal. Thought content normal.  Vitals reviewed.         Assessment & Plan:  R shoulder pain- new.  Not true shoulder pain but consistent w/ trap spasm.  Continue NSAIDs- prescription given.  Flexeril at night for additional relief.  Reviewed supportive care and red flags that should prompt return. Pt expressed understanding and is in agreement w/ plan.   Urinary odor- new.  UA WNL.  Will check for BV given odor x1 month.  Will tx prn.

## 2016-11-06 LAB — URINE CYTOLOGY ANCILLARY ONLY: Bacterial vaginitis: NEGATIVE

## 2016-12-11 ENCOUNTER — Encounter: Payer: Self-pay | Admitting: Family Medicine

## 2016-12-11 ENCOUNTER — Ambulatory Visit (INDEPENDENT_AMBULATORY_CARE_PROVIDER_SITE_OTHER): Payer: Managed Care, Other (non HMO) | Admitting: Family Medicine

## 2016-12-11 VITALS — BP 124/80 | HR 80 | Temp 98.0°F | Resp 16 | Ht 69.0 in | Wt 193.2 lb

## 2016-12-11 DIAGNOSIS — Z0000 Encounter for general adult medical examination without abnormal findings: Secondary | ICD-10-CM

## 2016-12-11 DIAGNOSIS — E785 Hyperlipidemia, unspecified: Secondary | ICD-10-CM | POA: Diagnosis not present

## 2016-12-11 DIAGNOSIS — E559 Vitamin D deficiency, unspecified: Secondary | ICD-10-CM

## 2016-12-11 LAB — HEPATIC FUNCTION PANEL
ALBUMIN: 4.2 g/dL (ref 3.5–5.2)
ALT: 10 U/L (ref 0–35)
AST: 12 U/L (ref 0–37)
Alkaline Phosphatase: 116 U/L (ref 39–117)
Bilirubin, Direct: 0 mg/dL (ref 0.0–0.3)
Total Bilirubin: 0.4 mg/dL (ref 0.2–1.2)
Total Protein: 6.5 g/dL (ref 6.0–8.3)

## 2016-12-11 LAB — BASIC METABOLIC PANEL
BUN: 10 mg/dL (ref 6–23)
CO2: 26 meq/L (ref 19–32)
CREATININE: 0.86 mg/dL (ref 0.40–1.20)
Calcium: 9.3 mg/dL (ref 8.4–10.5)
Chloride: 103 mEq/L (ref 96–112)
GFR: 75.98 mL/min (ref >60.00)
Glucose, Bld: 90 mg/dL (ref 70–99)
Potassium: 4.2 mEq/L (ref 3.5–5.1)
Sodium: 136 mEq/L (ref 135–145)

## 2016-12-11 LAB — CBC WITH DIFFERENTIAL/PLATELET
Basophils Absolute: 0.1 10*3/uL (ref 0.0–0.1)
Basophils Relative: 1.3 % (ref 0.0–3.0)
EOS ABS: 0.2 10*3/uL (ref 0.0–0.7)
Eosinophils Relative: 2.2 % (ref 0.0–5.0)
HCT: 45.5 % (ref 36.0–46.0)
HEMOGLOBIN: 15.6 g/dL — AB (ref 12.0–15.0)
Lymphocytes Relative: 27.5 % (ref 12.0–46.0)
Lymphs Abs: 2.1 10*3/uL (ref 0.7–4.0)
MCHC: 34.3 g/dL (ref 30.0–36.0)
MCV: 90.5 fl (ref 78.0–100.0)
MONO ABS: 0.4 10*3/uL (ref 0.1–1.0)
Monocytes Relative: 5.7 % (ref 3.0–12.0)
Neutro Abs: 4.9 10*3/uL (ref 1.4–7.7)
Neutrophils Relative %: 63.3 % (ref 43.0–77.0)
Platelets: 321 10*3/uL (ref 150.0–400.0)
RBC: 5.03 Mil/uL (ref 3.87–5.11)
RDW: 13.5 % (ref 11.5–15.5)
WBC: 7.7 10*3/uL (ref 4.0–10.5)

## 2016-12-11 LAB — LIPID PANEL
CHOL/HDL RATIO: 5
Cholesterol: 228 mg/dL — ABNORMAL HIGH (ref 0–200)
HDL: 43.6 mg/dL (ref >39.00)
LDL Cholesterol: 164 mg/dL — ABNORMAL HIGH (ref 0–99)
NonHDL: 184.75
TRIGLYCERIDES: 106 mg/dL (ref 0.0–149.0)
VLDL: 21.2 mg/dL (ref 0.0–40.0)

## 2016-12-11 LAB — VITAMIN D 25 HYDROXY (VIT D DEFICIENCY, FRACTURES): VITD: 21.21 ng/mL — AB (ref 30.00–100.00)

## 2016-12-11 LAB — TSH: TSH: 1.49 u[IU]/mL (ref 0.35–4.50)

## 2016-12-11 MED ORDER — VENLAFAXINE HCL ER 150 MG PO CP24: 150.0000 mg | ORAL_CAPSULE | Freq: Every day | ORAL | 6 refills | Status: DC

## 2016-12-11 NOTE — Patient Instructions (Addendum)
Follow up in 1 year or as needed We'll notify you of your lab results and make any changes if needed Continue to work on healthy diet and regular exercise- you can do it! Call and schedule your GYN appt w/ Dr Lynnette Caffey [phone] QUIT SMOKING! Call with any questions or concerns Happy Fall!!!

## 2016-12-11 NOTE — Addendum Note (Signed)
Addended by: Midge Minium on: 12/11/2016 09:38 AM   Modules accepted: Orders

## 2016-12-11 NOTE — Assessment & Plan Note (Signed)
Noted on last labs.  Pt has gained 6 lbs since last visit.  Check labs.  Will start meds prn.

## 2016-12-11 NOTE — Progress Notes (Signed)
   Subjective:    Patient ID: Angela Evans, female    DOB: 06-18-1970, 46 y.o.   MRN: [id number]  HPI CPE- overdue on pap, mammo (needs to schedule w/ Dr Lynnette Caffey).  UTD on Tdap.  Declines flu shot.   Review of Systems Patient reports no vision/ hearing changes, adenopathy,fever, weight change,  persistant/recurrent hoarseness , swallowing issues, chest pain, palpitations, edema, persistant/recurrent cough, hemoptysis, dyspnea (rest/exertional/paroxysmal nocturnal), gastrointestinal bleeding (melena, rectal bleeding), abdominal pain, significant heartburn, bowel changes, GU symptoms (dysuria, hematuria, incontinence), Gyn symptoms (abnormal  bleeding, pain),  syncope, focal weakness, memory loss, numbness & tingling, skin/hair/nail changes, abnormal bruising or bleeding, anxiety, or depression.     Objective:   Physical Exam General Appearance:    Alert, cooperative, no distress, appears stated age  Head:    Normocephalic, without obvious abnormality, atraumatic  Eyes:    PERRL, conjunctiva/corneas clear, EOM's intact, fundi    benign, both eyes  Ears:    Normal TM's and external ear canals, both ears  Nose:   Nares normal, septum midline, mucosa normal, no drainage    or sinus tenderness  Throat:   Lips, mucosa, and tongue normal; teeth and gums normal  Neck:   Supple, symmetrical, trachea midline, no adenopathy;    Thyroid: no enlargement/tenderness/nodules  Back:     Symmetric, no curvature, ROM normal, no CVA tenderness  Lungs:     Clear to auscultation bilaterally, respirations unlabored  Chest Wall:    No tenderness or deformity   Heart:    Regular rate and rhythm, S1 and S2 normal, no murmur, rub   or gallop  Breast Exam:    Deferred to GYN  Abdomen:     Soft, non-tender, bowel sounds active all four quadrants,    no masses, no organomegaly  Genitalia:    Deferred to GYN  Rectal:    Extremities:   Extremities normal, atraumatic, no cyanosis or edema  Pulses:   2+ and  symmetric all extremities  Skin:   Skin color, texture, turgor normal, no rashes or lesions  Lymph nodes:   Cervical, supraclavicular, and axillary nodes normal  Neurologic:   CNII-XII intact, normal strength, sensation and reflexes    throughout          Assessment & Plan:

## 2016-12-11 NOTE — Progress Notes (Signed)
Pre visit review using our clinic review tool, if applicable. No additional management support is needed unless otherwise documented below in the visit note. 

## 2016-12-11 NOTE — Assessment & Plan Note (Signed)
Pt's PE WNL w/ exception of weight gain.  Overdue for pap/mammo- pt to schedule.  UTD on Tdap.  Check labs.  Anticipatory guidance provided.

## 2016-12-11 NOTE — Assessment & Plan Note (Signed)
Check labs.  Replete prn. 

## 2016-12-12 ENCOUNTER — Other Ambulatory Visit: Payer: Self-pay | Admitting: General Practice

## 2016-12-12 DIAGNOSIS — E785 Hyperlipidemia, unspecified: Secondary | ICD-10-CM

## 2016-12-12 MED ORDER — ATORVASTATIN CALCIUM 20 MG PO TABS: 20.0000 mg | ORAL_TABLET | Freq: Every day | ORAL | 1 refills | Status: DC

## 2016-12-12 MED ORDER — VITAMIN D (ERGOCALCIFEROL) 1.25 MG (50000 UNIT) PO CAPS: 50000.0000 [IU] | ORAL_CAPSULE | ORAL | 0 refills | Status: DC

## 2017-01-22 ENCOUNTER — Other Ambulatory Visit: Payer: Managed Care, Other (non HMO)

## 2017-02-27 LAB — HM PAP SMEAR

## 2017-05-30 LAB — HM MAMMOGRAPHY

## 2017-08-06 ENCOUNTER — Other Ambulatory Visit: Payer: Self-pay | Admitting: Family Medicine

## 2017-10-23 ENCOUNTER — Other Ambulatory Visit: Payer: Self-pay

## 2017-10-23 ENCOUNTER — Ambulatory Visit: Payer: Managed Care, Other (non HMO) | Admitting: Family Medicine

## 2017-10-23 ENCOUNTER — Encounter: Payer: Self-pay | Admitting: Family Medicine

## 2017-10-23 ENCOUNTER — Other Ambulatory Visit (HOSPITAL_COMMUNITY)
Admission: RE | Admit: 2017-10-23 | Discharge: 2017-10-23 | Disposition: A | Discharge status: Home / Self Care | Payer: Managed Care, Other (non HMO) | Source: Ambulatory Visit | Attending: Family Medicine | Admitting: Family Medicine

## 2017-10-23 VITALS — BP 120/90 | HR 74 | Temp 98.2°F | Ht 69.0 in

## 2017-10-23 DIAGNOSIS — R8290 Unspecified abnormal findings in urine: Secondary | ICD-10-CM | POA: Insufficient documentation

## 2017-10-23 DIAGNOSIS — R350 Frequency of micturition: Secondary | ICD-10-CM | POA: Diagnosis present

## 2017-10-23 LAB — POCT URINALYSIS DIPSTICK
BILIRUBIN UA: NEGATIVE
Glucose, UA: NEGATIVE
KETONES UA: NEGATIVE
Leukocytes, UA: NEGATIVE
Nitrite, UA: NEGATIVE
PH UA: 6.5 (ref 5.0–8.0)
Protein, UA: NEGATIVE
RBC UA: NEGATIVE
Spec Grav, UA: <=1.005 — AB (ref 1.010–1.025)
UROBILINOGEN UA: 0.2 U/dL

## 2017-10-23 MED ORDER — TOLTERODINE TARTRATE ER 4 MG PO CP24: 4.0000 mg | ORAL_CAPSULE | Freq: Every day | ORAL | 2 refills | Status: DC

## 2017-10-23 NOTE — Patient Instructions (Signed)
Please follow up if symptoms do not improve or as needed.   I will release your lab results to you on your MyChart account with further instructions. Please reply with any questions.   Monitor your symptoms of overactive bladder, and if the urine culture and vaginal swab testing are negative, you may try the detrol LA to see if this helps your symptoms.    Overactive Bladder, Adult Overactive bladder is a group of urinary symptoms. With overactive bladder, you may suddenly feel the need to pass urine (urinate) right away. After feeling this sudden urge, you might also leak urine if you cannot get to the bathroom fast enough (urinary incontinence). These symptoms might interfere with your daily work or social activities. Overactive bladder symptoms may also wake you up at night. Overactive bladder affects the nerve signals between your bladder and your brain. Your bladder may get the signal to empty before it is full. Very sensitive muscles can also make your bladder squeeze too soon. What are the causes? Many things can cause an overactive bladder. Possible causes include:  Urinary tract infection.  Infection of nearby tissues, such as the prostate.  Prostate enlargement.  Being pregnant with twins or more (multiples).  Surgery on the uterus or urethra.  Bladder stones, inflammation, or tumors.  Drinking too much caffeine or alcohol.  Certain medicines, especially those that you take to help your body get rid of extra fluid (diuretics) by increasing urine production.  Muscle or nerve weakness, especially from: ? A spinal cord injury. ? Stroke. ? Multiple sclerosis. ? Parkinson disease.  Diabetes. This can cause a high urine volume that fills the bladder so quickly that the normal urge to urinate is triggered very strongly.  Constipation. A buildup of too much stool can put pressure on your bladder.  What increases the risk? You may be at greater risk for overactive bladder if  you:  Are an older adult.  Smoke.  Are going through menopause.  Have prostate problems.  Have a neurological disease, such as stroke, dementia, Parkinson disease, or multiple sclerosis (MS).  Eat or drink things that irritate the bladder. These include alcohol, spicy food, and caffeine.  Are overweight or obese.  What are the signs or symptoms? The signs and symptoms of an overactive bladder include:  Sudden, strong urges to urinate.  Leaking urine.  Urinating eight or more times per day.  Waking up to urinate two or more times per night.  How is this diagnosed? Your health care provider may suspect overactive bladder based on your symptoms. The health care provider will do a physical exam and take your medical history. Blood or urine tests may also be done. For example, you might need to have a bladder function test to check how well you can hold your urine. You might also need to see a health care provider who specializes in the urinary tract (urologist). How is this treated? Treatment for overactive bladder depends on the cause of your condition and whether it is mild or severe. Certain treatments can be done in your health care provider's office or clinic. You can also make lifestyle changes at home. Options include: Behavioral Treatments  Biofeedback. A specialist uses sensors to help you become aware of your body's signals.  Keeping a daily log of when you need to urinate and what happens after the urge. This may help you manage your condition.  Bladder training. This helps you learn to control the urge to urinate by following  a schedule that directs you to urinate at regular intervals (timed voiding). At first, you might have to wait a few minutes after feeling the urge. In time, you should be able to schedule bathroom visits an hour or more apart.  Kegel exercises. These are exercises to strengthen the pelvic floor muscles, which support the bladder. Toning these  muscles can help you control urination, even if your bladder muscles are overactive. A specialist will teach you how to do these exercises correctly. They require daily practice.  Weight loss. If you are obese or overweight, losing weight might relieve your symptoms of overactive bladder. Talk to your health care provider about losing weight and whether there is a specific program or method that would work best for you.  Diet change. This might help if constipation is making your overactive bladder worse. Your health care provider or a dietitian can explain ways to change what you eat to ease constipation. You might also need to consume less alcohol and caffeine or drink other fluids at different times of the day.  Stopping smoking.  Wearing pads to absorb leakage while you wait for other treatments to take effect. Physical Treatments  Electrical stimulation. Electrodes send gentle pulses of electricity to strengthen the nerves or muscles that help to control the bladder. Sometimes, the electrodes are placed outside of the body. In other cases, they might be placed inside the body (implanted). This treatment can take several months to have an effect.  Supportive devices. Women may need a plastic device that fits into the vagina and supports the bladder (pessary). Medicines Several medicines can help treat overactive bladder and are usually used along with other treatments. Some are injected into the muscles involved in urination. Others come in pill form. Your health care provider may prescribe:  Antispasmodics. These medicines block the signals that the nerves send to the bladder. This keeps the bladder from releasing urine at the wrong time.  Tricyclic antidepressants. These types of antidepressants also relax bladder muscles.  Surgery  You may have a device implanted to help manage the nerve signals that indicate when you need to urinate.  You may have surgery to implant electrodes for  electrical stimulation.  Sometimes, very severe cases of overactive bladder require surgery to change the shape of the bladder. Follow these instructions at home:  Take medicines only as directed by your health care provider.  Use any implants or a pessary as directed by your health care provider.  Make any diet or lifestyle changes that are recommended by your health care provider. These might include: ? Drinking less fluid or drinking at different times of the day. If you need to urinate often during the night, you may need to stop drinking fluids early in the evening. ? Cutting down on caffeine or alcohol. Both can make an overactive bladder worse. Caffeine is found in coffee, tea, and sodas. ? Doing Kegel exercises to strengthen muscles. ? Losing weight if you need to. ? Eating a healthy and balanced diet to prevent constipation.  Keep a journal or log to track how much and when you drink and also when you feel the need to urinate. This will help your health care provider to monitor your condition. Contact a health care provider if:  Your symptoms do not get better after treatment.  Your pain and discomfort are getting worse.  You have more frequent urges to urinate.  You have a fever. Get help right away if: You are not able  to control your bladder at all. This information is not intended to replace advice given to you by your health care provider. Make sure you discuss any questions you have with your health care provider. Document Released: 12/31/2008 Document Revised: 08/12/2015 Document Reviewed: 07/30/2013 Elsevier Interactive Patient Education  [employer].

## 2017-10-23 NOTE — Progress Notes (Signed)
Subjective   CC:  Chief Complaint  Patient presents with  . Urinary Frequency    x 2 weeks, tingling after she finishes urinating    HPI: Angela Evans is a 47 y.o. female who presents to the office today to address the problems listed above in the chief complaint.  Patient reports urinary frequency x 2 weeks.  She has sensation of increased urinary pressure.  She denies fevers flank pain nausea vomiting or gross hematuria.  She has also noticed an odor to the urine.  She denies history of interstitial cystitis.  She denies vaginal symptoms including vaginal discharge or pelvic pain. No significant urgency but does have stress urinary incontinence at times. sxs have affected her: she travels for work and has to stop all the time to go to the bathroom. Feels like she is normally emptying her bladder. No pain.   Assessment  1. Urinary frequency   2. Abnormal urine odor      Plan   ? OAB: r/o vaginitis / cystitis / urethritis with labs. If negative, trial of detrol la. Education given.  Follow up: Return if symptoms worsen or fail to improve.  Orders Placed This Encounter  Procedures  . Urine Culture  . POCT urinalysis dipstick   Meds ordered this encounter  Medications  . tolterodine (DETROL LA) 4 MG 24 hr capsule    Sig: Take 1 capsule (4 mg total) by mouth daily.    Dispense:  30 capsule    Refill:  2      I reviewed the patients updated PMH, FH, and SocHx.    Patient Active Problem List   Diagnosis Date Noted  . Hyperlipidemia 12/11/2016  . Vitamin D deficiency 12/11/2016  . Rash and nonspecific skin eruption 12/30/2014  . Strain of left pectoralis muscle 12/30/2014  . Allergic reaction 08/25/2014  . Back pain 08/25/2014  . Urinary frequency 12/16/2013  . Abdominal pain, other specified site 12/16/2013  . Routine general medical examination at a health care facility 01/20/2013  . Ankle sprain 07/18/2011  . Weight gain 07/03/2011  . Excessive or frequent  menstruation 05/11/2008  . TOBACCO USE 02/26/2008  . Depression 02/26/2008  . VAGINITIS 02/26/2008   Current Meds  Medication Sig  . cholecalciferol (VITAMIN D) 1000 units tablet Take 1,000 Units by mouth daily.  Marland Kitchen venlafaxine XR (EFFEXOR-XR) 150 MG 24 hr capsule TAKE 1 CAPSULE WITH BREAKFAST.    Review of Systems: Cardiovascular: negative for chest pain Respiratory: negative for SOB or persistent cough Gastrointestinal: negative for abdominal pain Constitutional: Negative for fever malaise or anorexia  Objective  Vitals: BP 120/90   Pulse 74   Temp 98.2 F (36.8 C)   Ht 5\' 9"  (1.753 m)   LMP 10/09/2017   SpO2 97%   BMI 28.54 kg/m  General: no acute distress  Psych:  Alert and oriented, normal mood and affect Cardiovascular:  RRR without murmur or gallop. no peripheral edema Respiratory:  Good breath sounds bilaterally, CTAB with normal respiratory effort Gastrointestinal: soft, flat abdomen, normal active bowel sounds, no palpable masses, no hepatosplenomegaly, no appreciated hernias, NO CVAT, mild suprapubic ttp w/o rebound or guarding Skin:  Warm, no rashes Neurologic:   Mental status is normal. normal gait Office Visit on 10/23/2017  Component Date Value Ref Range Status  . Color, UA 10/23/2017 yellow   Final  . Clarity, UA 10/23/2017 clear   Final  . Glucose, UA 10/23/2017 Negative  Negative Final  . Bilirubin, UA 10/23/2017  Negative   Final  . Ketones, UA 10/23/2017 Negative   Final  . Spec Grav, UA 10/23/2017 <=1.005* 1.010 - 1.025 Final  . Blood, UA 10/23/2017 neg   Final  . pH, UA 10/23/2017 6.5  5.0 - 8.0 Final  . Protein, UA 10/23/2017 Negative  Negative Final  . Urobilinogen, UA 10/23/2017 0.2  0.2 or 1.0 E.U./dL Final  . Nitrite, UA 10/23/2017 neg   Final  . Leukocytes, UA 10/23/2017 Negative  Negative Final    Commons side effects, risks, benefits, and alternatives for medications and treatment plan prescribed today were discussed, and the patient  expressed understanding of the given instructions. Patient is instructed to call or message via MyChart if he/she has any questions or concerns regarding our treatment plan. No barriers to understanding were identified. We discussed Red Flag symptoms and signs in detail. Patient expressed understanding regarding what to do in case of urgent or emergency type symptoms.   Medication list was reconciled, printed and provided to the patient in AVS. Patient instructions and summary information was reviewed with the patient as documented in the AVS. This note was prepared with assistance of Dragon voice recognition software. Occasional wrong-word or sound-a-like substitutions may have occurred due to the inherent limitations of voice recognition software

## 2017-10-25 LAB — CERVICOVAGINAL ANCILLARY ONLY
Bacterial vaginitis: NEGATIVE
CANDIDA VAGINITIS: NEGATIVE
TRICH (WINDOWPATH): NEGATIVE

## 2017-10-25 LAB — URINE CULTURE
MICRO NUMBER: 90929068
SPECIMEN QUALITY: ADEQUATE

## 2017-10-25 MED ORDER — NITROFURANTOIN MONOHYD MACRO 100 MG PO CAPS: 100.0000 mg | ORAL_CAPSULE | Freq: Two times a day (BID) | ORAL | 0 refills | Status: DC

## 2017-10-25 NOTE — Addendum Note (Signed)
Addended by: Billey Chang on: 10/25/2017 11:40 AM   Modules accepted: Orders

## 2017-10-29 ENCOUNTER — Ambulatory Visit: Payer: Self-pay

## 2017-10-29 NOTE — Telephone Encounter (Signed)
Last OV 10/23/17, No future OV

## 2017-10-29 NOTE — Telephone Encounter (Signed)
Headache is a known side effect of Macrobid and can be very painful.  STOP all abx and let me know if there are continued urinary sxs.  If yes, we will need to switch abx but if not, we can stop meds at this time

## 2017-10-29 NOTE — Telephone Encounter (Signed)
Outgoing call to patient who states that She has developed hives since  Yesterday and a headache for the past 3 days. Headache rated 8. Patient states that she has 2 days left of Macrobid left. Rash is located on head, face and waist.  Not able to count how many.  States the size of a quarter.  Itching is present.  Patient states it is " annoying".  Patient thinks it is a reaction to Macrobid.  Denies fever, tongue swelling, difficulty breathing, and abdominal pain.  ReccomendedBenaryl when at home and preparing for bed. Provided care advice Instructed patient to call if symptoms worsen or if difficulty of breathing occurs.  Patient voiced understanding.       Reason for Disposition . Widespread hives  Answer Assessment - Initial Assessment Questions 1. APPEARANCE: "What does the rash look like?"      *hives 2. LOCATION: "Where is the rash located?"      Had face waist 3. NUMBER: "How many hives are there?"      Different  4. SIZE: "How big are the hives?" (inches, cm, compare to coins) "Do they all look the same or is there lots of variation in shape and size?"      Size of a quarter 5. ONSET: "When did the hives begin?" (Hours or days ago)      Yesterday am 6. ITCHING: "Does it itch?" If so, ask: "How bad is the itch?"    - MILD: doesn't interfere with normal activities   - MODERATE - SEVERE: interferes with work, school, sleep, or other activities      anooying 7. RECURRENT PROBLEM: "Have you had hives before?" If so, ask: "When was the last time?" and "What happened that time?"      na 8. TRIGGERS: "Were you exposed to any new food, plant, cosmetic product or animal just before the hives began?"     medication 9. OTHER SYMPTOMS: "Do you have any other symptoms?" (e.g., fever, tongue swelling, difficulty breathing, abdominal pain)     no 10. PREGNANCY: "Is there any chance you are pregnant?" "When was your last menstrual period?"       no  Protocols used: HIVES-A-AH

## 2017-10-29 NOTE — Telephone Encounter (Signed)
Called and advised pt of PCP recommendations. She advised she only had a day and a half left of the medication. She stated that she is feeling good right now. Pt advised to call office if symptoms return.

## 2017-10-30 ENCOUNTER — Encounter: Payer: Self-pay | Admitting: Family Medicine

## 2017-10-30 MED ORDER — HYDROXYZINE HCL 50 MG PO TABS: 50.0000 mg | ORAL_TABLET | Freq: Three times a day (TID) | ORAL | 0 refills | Status: DC | PRN

## 2017-11-30 ENCOUNTER — Other Ambulatory Visit: Payer: Self-pay | Admitting: Family Medicine

## 2017-12-06 ENCOUNTER — Other Ambulatory Visit: Payer: Self-pay

## 2017-12-06 ENCOUNTER — Encounter: Payer: Self-pay | Admitting: Family Medicine

## 2017-12-06 ENCOUNTER — Ambulatory Visit: Payer: Managed Care, Other (non HMO) | Admitting: Family Medicine

## 2017-12-06 VITALS — BP 121/81 | HR 71 | Temp 98.2°F | Resp 16 | Ht 69.0 in | Wt 210.2 lb

## 2017-12-06 DIAGNOSIS — E785 Hyperlipidemia, unspecified: Secondary | ICD-10-CM | POA: Diagnosis not present

## 2017-12-06 DIAGNOSIS — R635 Abnormal weight gain: Secondary | ICD-10-CM

## 2017-12-06 DIAGNOSIS — F331 Major depressive disorder, recurrent, moderate: Secondary | ICD-10-CM | POA: Diagnosis not present

## 2017-12-06 DIAGNOSIS — N393 Stress incontinence (female) (male): Secondary | ICD-10-CM | POA: Diagnosis not present

## 2017-12-06 LAB — CBC WITH DIFFERENTIAL/PLATELET
BASOS ABS: 0.1 10*3/uL (ref 0.0–0.1)
BASOS PCT: 1.4 % (ref 0.0–3.0)
Eosinophils Absolute: 0.2 10*3/uL (ref 0.0–0.7)
Eosinophils Relative: 3.7 % (ref 0.0–5.0)
HEMATOCRIT: 46.8 % — AB (ref 36.0–46.0)
Hemoglobin: 16.1 g/dL — ABNORMAL HIGH (ref 12.0–15.0)
LYMPHS ABS: 1.2 10*3/uL (ref 0.7–4.0)
Lymphocytes Relative: 22.4 % (ref 12.0–46.0)
MCHC: 34.4 g/dL (ref 30.0–36.0)
MCV: 88.5 fl (ref 78.0–100.0)
Monocytes Absolute: 0.7 10*3/uL (ref 0.1–1.0)
Monocytes Relative: 11.7 % (ref 3.0–12.0)
NEUTROS ABS: 3.4 10*3/uL (ref 1.4–7.7)
NEUTROS PCT: 60.8 % (ref 43.0–77.0)
PLATELETS: 237 10*3/uL (ref 150.0–400.0)
RBC: 5.29 Mil/uL — ABNORMAL HIGH (ref 3.87–5.11)
RDW: 12.9 % (ref 11.5–15.5)
WBC: 5.5 10*3/uL (ref 4.0–10.5)

## 2017-12-06 LAB — BASIC METABOLIC PANEL
BUN: 8 mg/dL (ref 6–23)
CO2: 23 meq/L (ref 19–32)
Calcium: 8.8 mg/dL (ref 8.4–10.5)
Chloride: 106 mEq/L (ref 96–112)
Creatinine, Ser: 0.99 mg/dL (ref 0.40–1.20)
GFR: 64.3 mL/min (ref >60.00)
GLUCOSE: 86 mg/dL (ref 70–99)
POTASSIUM: 4.2 meq/L (ref 3.5–5.1)
SODIUM: 136 meq/L (ref 135–145)

## 2017-12-06 LAB — HEPATIC FUNCTION PANEL
ALK PHOS: 111 U/L (ref 39–117)
ALT: 21 U/L (ref 0–35)
AST: 20 U/L (ref 0–37)
Albumin: 4 g/dL (ref 3.5–5.2)
BILIRUBIN DIRECT: 0.1 mg/dL (ref 0.0–0.3)
BILIRUBIN TOTAL: 0.3 mg/dL (ref 0.2–1.2)
TOTAL PROTEIN: 6.4 g/dL (ref 6.0–8.3)

## 2017-12-06 LAB — LIPID PANEL
CHOLESTEROL: 232 mg/dL — AB (ref 0–200)
HDL: 30.8 mg/dL — ABNORMAL LOW (ref >39.00)
LDL CALC: 165 mg/dL — AB (ref 0–99)
NONHDL: 201.21
Total CHOL/HDL Ratio: 8
Triglycerides: 182 mg/dL — ABNORMAL HIGH (ref 0.0–149.0)
VLDL: 36.4 mg/dL (ref 0.0–40.0)

## 2017-12-06 LAB — HEMOGLOBIN A1C: Hgb A1c MFr Bld: 5.7 % (ref 4.6–6.5)

## 2017-12-06 LAB — TSH: TSH: 1.34 u[IU]/mL (ref 0.35–4.50)

## 2017-12-06 MED ORDER — VENLAFAXINE HCL ER 150 MG PO CP24: 150.0000 mg | ORAL_CAPSULE | Freq: Every day | ORAL | 1 refills | Status: DC

## 2017-12-06 MED ORDER — BUPROPION HCL ER (XL) 150 MG PO TB24: 150.0000 mg | ORAL_TABLET | Freq: Every day | ORAL | 3 refills | Status: DC

## 2017-12-06 NOTE — Assessment & Plan Note (Signed)
Pt has gained another 17 lbs since last visit.  Pt admits to poor diet and lack of exercise.  Stressed the need for both.  Check labs to r/o thyroid or metabolic cause.  Will follow closely.

## 2017-12-06 NOTE — Progress Notes (Signed)
   Subjective:    Patient ID: Angela Evans, female    DOB: 06-May-1970, 47 y.o.   MRN: [id number]  HPI Depression- chronic problem, pt scored 15 on PHQ9 despite being prescribed Effexor 150mg  daily.  Pt is very unhappy about the weight gain, 'I feel gross'.  'i'm just not motivated'.  + fatigue.  Work is stressful, daughter graduated college and has moved home.  Taking Effexor daily.  Weight gain- pt has gained 17 lbs since last visit.  Not exercising, not following a particular.  Hyperlipidemia- chronic problem, not currently on medications.  Pt has gained considerable weight.  Due for repeat labs.  Denies CP, SOB, abd pain, N/V.  Stress incontinence- unable to laugh cough or sneeze w/o leaking  Review of Systems For ROS see HPI     Objective:   Physical Exam  Constitutional: She is oriented to person, place, and time. She appears well-developed and well-nourished. No distress.  obese  HENT:  Head: Normocephalic and atraumatic.  Eyes: Pupils are equal, round, and reactive to light. Conjunctivae and EOM are normal.  Neck: Normal range of motion. Neck supple. No thyromegaly present.  Cardiovascular: Normal rate, regular rhythm, normal heart sounds and intact distal pulses.  No murmur heard. Pulmonary/Chest: Effort normal and breath sounds normal. No respiratory distress.  Abdominal: Soft. She exhibits no distension. There is no tenderness.  Musculoskeletal: She exhibits no edema.  Lymphadenopathy:    She has no cervical adenopathy.  Neurological: She is alert and oriented to person, place, and time.  Skin: Skin is warm and dry.  Psychiatric: She has a normal mood and affect. Her behavior is normal.  Vitals reviewed.         Assessment & Plan:

## 2017-12-06 NOTE — Assessment & Plan Note (Signed)
New.  Reviewed Kegel exercises and that for true stress incontinence, Detrol will not help.  Pt has not started this medication and prefers not to add medication if possible.  If no improvement w/ Kegels, can refer for pelvic floor PT.  Pt expressed understanding and is in agreement w/ plan.

## 2017-12-06 NOTE — Assessment & Plan Note (Signed)
Pt is not currently on medication but w/ a 17 lb weight gain, we need to revisit this.  Check labs and start meds prn.

## 2017-12-06 NOTE — Assessment & Plan Note (Signed)
Deteriorated.  PHQ 15 despite daily Effexor.  She has a difficult situation w/ her daughter moving home after college graduation.  Add Wellbutrin.  Will follow closely.

## 2017-12-06 NOTE — Patient Instructions (Signed)
Follow up in 1 month to recheck mood Schedule your complete physical in 6 months We'll notify you of your lab results and make any changes if needed Try and work on healthy diet and regular exercise- you can do it! ADD the Wellbutrin once daily to improve mood Do Kegel exercises throughout the day to strengthen your pelvic floor muscles and help w/ stress incontinence Call with any questions or concerns Hang in there!

## 2017-12-07 ENCOUNTER — Other Ambulatory Visit: Payer: Self-pay | Admitting: General Practice

## 2017-12-07 DIAGNOSIS — E785 Hyperlipidemia, unspecified: Secondary | ICD-10-CM

## 2017-12-07 MED ORDER — ATORVASTATIN CALCIUM 20 MG PO TABS: 20.0000 mg | ORAL_TABLET | Freq: Every day | ORAL | 3 refills | Status: DC

## 2018-01-01 ENCOUNTER — Ambulatory Visit: Payer: Managed Care, Other (non HMO) | Admitting: Family Medicine

## 2018-01-01 ENCOUNTER — Encounter: Payer: Self-pay | Admitting: Family Medicine

## 2018-01-01 ENCOUNTER — Other Ambulatory Visit: Payer: Self-pay

## 2018-01-01 VITALS — BP 120/82 | HR 65 | Temp 97.8°F | Resp 14 | Ht 69.0 in | Wt 210.0 lb

## 2018-01-01 DIAGNOSIS — F331 Major depressive disorder, recurrent, moderate: Secondary | ICD-10-CM

## 2018-01-01 MED ORDER — BUPROPION HCL ER (SR) 200 MG PO TB12: 200.0000 mg | ORAL_TABLET | Freq: Two times a day (BID) | ORAL | 3 refills | Status: DC

## 2018-01-01 NOTE — Progress Notes (Signed)
   Subjective:    Patient ID: Angela Evans, female    DOB: 1970-12-19, 47 y.o.   MRN: [id number]  HPI Depression- chronic problem.  On Venlafaxine 150mg  daily and Wellbutrin was added last visit.  Pt feels mood is improving.  More active, better motivation.  Has started exercising.  Daughter remains challenging.     Review of Systems For ROS see HPI     Objective:   Physical Exam  Constitutional: She is oriented to person, place, and time. She appears well-developed and well-nourished. No distress.  HENT:  Head: Normocephalic and atraumatic.  Neurological: She is alert and oriented to person, place, and time.  Psychiatric: She has a normal mood and affect. Her behavior is normal. Thought content normal.  Vitals reviewed.         Assessment & Plan:

## 2018-01-01 NOTE — Assessment & Plan Note (Signed)
Improved since starting Wellbutrin but pt feels there is still room for improvement.  Will increase Wellbutrin to 200mg  SR b/c jumping from 150 --> 300mg  was concerning for pt.  Continue Effexor daily.  Will follow.

## 2018-01-01 NOTE — Patient Instructions (Signed)
Follow up by phone or MyChart 4-6 weeks after starting the new medication to let me know how the increased dose is working [employer] the 150s and then START the 200mg  twice daily Call with any questions or concerns Hang in there!!!

## 2018-01-09 ENCOUNTER — Encounter: Payer: Self-pay | Admitting: General Practice

## 2018-02-12 ENCOUNTER — Encounter: Payer: Self-pay | Admitting: Family Medicine

## 2018-05-14 ENCOUNTER — Other Ambulatory Visit: Payer: Self-pay | Admitting: Family Medicine

## 2018-06-04 ENCOUNTER — Encounter: Payer: Self-pay | Admitting: Family Medicine

## 2018-06-06 ENCOUNTER — Ambulatory Visit (INDEPENDENT_AMBULATORY_CARE_PROVIDER_SITE_OTHER): Payer: Managed Care, Other (non HMO) | Admitting: Family Medicine

## 2018-06-06 ENCOUNTER — Other Ambulatory Visit: Payer: Self-pay

## 2018-06-06 ENCOUNTER — Encounter: Payer: Self-pay | Admitting: Family Medicine

## 2018-06-06 VITALS — BP 116/86 | HR 76 | Temp 98.4°F | Resp 16 | Ht 69.0 in | Wt 216.4 lb

## 2018-06-06 DIAGNOSIS — E785 Hyperlipidemia, unspecified: Secondary | ICD-10-CM | POA: Diagnosis not present

## 2018-06-06 DIAGNOSIS — F331 Major depressive disorder, recurrent, moderate: Secondary | ICD-10-CM

## 2018-06-06 DIAGNOSIS — E559 Vitamin D deficiency, unspecified: Secondary | ICD-10-CM

## 2018-06-06 DIAGNOSIS — Z0000 Encounter for general adult medical examination without abnormal findings: Secondary | ICD-10-CM

## 2018-06-06 LAB — CBC WITH DIFFERENTIAL/PLATELET
BASOS PCT: 0.9 % (ref 0.0–3.0)
Basophils Absolute: 0.1 10*3/uL (ref 0.0–0.1)
EOS ABS: 0.1 10*3/uL (ref 0.0–0.7)
Eosinophils Relative: 1.7 % (ref 0.0–5.0)
HCT: 43.8 % (ref 36.0–46.0)
HEMOGLOBIN: 15.1 g/dL — AB (ref 12.0–15.0)
Lymphocytes Relative: 24.1 % (ref 12.0–46.0)
Lymphs Abs: 2.1 10*3/uL (ref 0.7–4.0)
MCHC: 34.4 g/dL (ref 30.0–36.0)
MCV: 89.1 fl (ref 78.0–100.0)
MONO ABS: 0.5 10*3/uL (ref 0.1–1.0)
Monocytes Relative: 6.2 % (ref 3.0–12.0)
Neutro Abs: 5.7 10*3/uL (ref 1.4–7.7)
Neutrophils Relative %: 67.1 % (ref 43.0–77.0)
Platelets: 315 10*3/uL (ref 150.0–400.0)
RBC: 4.91 Mil/uL (ref 3.87–5.11)
RDW: 13.4 % (ref 11.5–15.5)
WBC: 8.5 10*3/uL (ref 4.0–10.5)

## 2018-06-06 LAB — HEPATIC FUNCTION PANEL
ALBUMIN: 3.9 g/dL (ref 3.5–5.2)
ALT: 15 U/L (ref 0–35)
AST: 13 U/L (ref 0–37)
Alkaline Phosphatase: 114 U/L (ref 39–117)
Bilirubin, Direct: 0.1 mg/dL (ref 0.0–0.3)
TOTAL PROTEIN: 6.1 g/dL (ref 6.0–8.3)
Total Bilirubin: 0.4 mg/dL (ref 0.2–1.2)

## 2018-06-06 LAB — BASIC METABOLIC PANEL
BUN: 10 mg/dL (ref 6–23)
CO2: 25 meq/L (ref 19–32)
CREATININE: 1.08 mg/dL (ref 0.40–1.20)
Calcium: 8.8 mg/dL (ref 8.4–10.5)
Chloride: 103 mEq/L (ref 96–112)
GFR: 54.6 mL/min — ABNORMAL LOW (ref >60.00)
GLUCOSE: 118 mg/dL — AB (ref 70–99)
Potassium: 4.2 mEq/L (ref 3.5–5.1)
Sodium: 135 mEq/L (ref 135–145)

## 2018-06-06 LAB — LIPID PANEL
Cholesterol: 203 mg/dL — ABNORMAL HIGH (ref 0–200)
HDL: 37.4 mg/dL — AB (ref >39.00)
LDL Cholesterol: 145 mg/dL — ABNORMAL HIGH (ref 0–99)
NonHDL: 166.06
TRIGLYCERIDES: 105 mg/dL (ref 0.0–149.0)
Total CHOL/HDL Ratio: 5
VLDL: 21 mg/dL (ref 0.0–40.0)

## 2018-06-06 LAB — TSH: TSH: 1.61 u[IU]/mL (ref 0.35–4.50)

## 2018-06-06 LAB — VITAMIN D 25 HYDROXY (VIT D DEFICIENCY, FRACTURES): VITD: 23.45 ng/mL — AB (ref 30.00–100.00)

## 2018-06-06 NOTE — Patient Instructions (Signed)
Follow up in 6 months to recheck cholesterol We'll notify you of your lab results and make any changes if needed Continue to work on healthy diet and regular exercise- you can do it!!! Please have Dr Lynnette Caffey send me a copy of your pap and mammo Call with any questions or concerns Stay Safe!!!

## 2018-06-06 NOTE — Assessment & Plan Note (Signed)
Pt's PE WNL w/ exception of obesity.  UTD on Tdap.  Has GYN appt scheduled.  Check labs.  Anticipatory guidance provided.

## 2018-06-06 NOTE — Progress Notes (Signed)
   Subjective:    Patient ID: Angela Evans, female    DOB: April 11, 1970, 48 y.o.   MRN: [id number]  HPI CPE- pt sees Dr Lynnette Caffey for GYN.  mammo scheduled.  UTD on Tdap.     Review of Systems Patient reports no vision/ hearing changes, adenopathy,fever, weight change,  persistant/recurrent hoarseness , swallowing issues, chest pain, palpitations, edema, persistant/recurrent cough, hemoptysis, dyspnea (rest/exertional/paroxysmal nocturnal), gastrointestinal bleeding (melena, rectal bleeding), abdominal pain, significant heartburn, bowel changes, GU symptoms (dysuria, hematuria, incontinence), Gyn symptoms (abnormal  bleeding, pain),  syncope, focal weakness, memory loss, numbness & tingling, skin/hair/nail changes, abnormal bruising or bleeding, anxiety, or depression.     Objective:   Physical Exam General Appearance:    Alert, cooperative, no distress, appears stated age, obese  Head:    Normocephalic, without obvious abnormality, atraumatic  Eyes:    PERRL, conjunctiva/corneas clear, EOM's intact, fundi    benign, both eyes  Ears:    Normal TM's and external ear canals, both ears  Nose:   Nares normal, septum midline, mucosa normal, no drainage    or sinus tenderness  Throat:   Lips, mucosa, and tongue normal; teeth and gums normal  Neck:   Supple, symmetrical, trachea midline, no adenopathy;    Thyroid: no enlargement/tenderness/nodules  Back:     Symmetric, no curvature, ROM normal, no CVA tenderness  Lungs:     Clear to auscultation bilaterally, respirations unlabored  Chest Wall:    No tenderness or deformity   Heart:    Regular rate and rhythm, S1 and S2 normal, no murmur, rub   or gallop  Breast Exam:    Deferred to GYN  Abdomen:     Soft, non-tender, bowel sounds active all four quadrants,    no masses, no organomegaly  Genitalia:    Deferred to GYN  Rectal:    Extremities:   Extremities normal, atraumatic, no cyanosis or edema  Pulses:   2+ and symmetric all extremities   Skin:   Skin color, texture, turgor normal, no rashes or lesions  Lymph nodes:   Cervical, supraclavicular, and axillary nodes normal  Neurologic:   CNII-XII intact, normal strength, sensation and reflexes    throughout          Assessment & Plan:

## 2018-06-06 NOTE — Assessment & Plan Note (Signed)
Pt has hx of this.  Check labs and replete prn. 

## 2018-06-06 NOTE — Assessment & Plan Note (Signed)
Chronic problem, currently well controlled on Venlafaxine and Wellbutrin.

## 2018-06-06 NOTE — Assessment & Plan Note (Signed)
Chronic problem.  Pt has not been taking recently.  Stressed need for daily medication use in addition to diet and exercise.  Check labs.  Adjust meds prn

## 2018-06-10 ENCOUNTER — Other Ambulatory Visit: Payer: Self-pay | Admitting: General Practice

## 2018-06-10 MED ORDER — VITAMIN D (ERGOCALCIFEROL) 1.25 MG (50000 UNIT) PO CAPS: 50000.0000 [IU] | ORAL_CAPSULE | ORAL | 0 refills | Status: DC

## 2018-08-29 ENCOUNTER — Other Ambulatory Visit: Payer: Self-pay | Admitting: Family Medicine

## 2018-11-13 ENCOUNTER — Other Ambulatory Visit: Payer: Self-pay | Admitting: Family Medicine

## 2018-11-28 ENCOUNTER — Other Ambulatory Visit: Payer: Self-pay | Admitting: Family Medicine

## 2018-12-09 ENCOUNTER — Ambulatory Visit: Payer: Managed Care, Other (non HMO) | Admitting: Family Medicine

## 2018-12-29 ENCOUNTER — Other Ambulatory Visit: Payer: Self-pay | Admitting: Family Medicine

## 2019-01-28 ENCOUNTER — Other Ambulatory Visit: Payer: Self-pay | Admitting: Family Medicine

## 2019-01-31 ENCOUNTER — Encounter: Payer: Self-pay | Admitting: Family Medicine

## 2019-01-31 ENCOUNTER — Ambulatory Visit (INDEPENDENT_AMBULATORY_CARE_PROVIDER_SITE_OTHER): Payer: BC Managed Care – PPO | Admitting: Family Medicine

## 2019-01-31 ENCOUNTER — Other Ambulatory Visit: Payer: Self-pay

## 2019-01-31 DIAGNOSIS — E785 Hyperlipidemia, unspecified: Secondary | ICD-10-CM | POA: Diagnosis not present

## 2019-01-31 DIAGNOSIS — F331 Major depressive disorder, recurrent, moderate: Secondary | ICD-10-CM | POA: Diagnosis not present

## 2019-01-31 NOTE — Progress Notes (Signed)
I have discussed the procedure for the virtual visit with the patient who has given consent to proceed with assessment and treatment.   Pt unable to obtain vitals.   Angela Evans, CMA     

## 2019-01-31 NOTE — Progress Notes (Signed)
   Virtual Visit via Video   I connected with patient on 01/31/19 at  8:30 AM EST by a video enabled telemedicine application and verified that I am speaking with the correct person using two identifiers.  Location patient: Home Location provider: Acupuncturist, Office Persons participating in the virtual visit: Patient, Provider, [place] (Jess B)  I discussed the limitations of evaluation and management by telemedicine and the availability of in person appointments. The patient expressed understanding and agreed to proceed.  Subjective:   HPI:   Hyperlipidemia- chronic problem, pt not taking her Lipitor 20mg  daily.  'I just didn't want to'.  But admits that she hasn't made any changes to diet or exercise.  No regular exercise.  No CP, SOB, HAs, abd pain, N/V.  No recent weight.  Depression- chronic problem, on Effexor 150mg  daily and Wellbutrin 200mg  BID.  Pt feels sxs are well controlled at this time.  'everything's going good'.  ROS:   See pertinent positives and negatives per HPI.  Patient Active Problem List   Diagnosis Date Noted  . Hyperlipidemia 12/11/2016  . Vitamin D deficiency 12/11/2016  . Routine general medical examination at a health care facility 01/20/2013  . Weight gain 07/03/2011  . TOBACCO USE 02/26/2008  . Depression 02/26/2008    Social History   Tobacco Use  . Smoking status: Current Every Day Smoker  . Smokeless tobacco: Never Used  Substance Use Topics  . Alcohol use: Yes    Current Outpatient Medications:  .  buPROPion (WELLBUTRIN SR) 200 MG 12 hr tablet, TAKE 1 TABLET BY MOUTH TWICE DAILY., Disp: 60 tablet, Rfl: 6 .  cholecalciferol (VITAMIN D) 1000 units tablet, Take 1,000 Units by mouth daily., Disp: , Rfl:  .  venlafaxine XR (EFFEXOR-XR) 150 MG 24 hr capsule, TAKE 1 CAPSULE WITH BREAKFAST., Disp: 14 capsule, Rfl: 0 .  atorvastatin (LIPITOR) 20 MG tablet, Take 1 tablet (20 mg total) by mouth daily. (Patient not taking: Reported on  06/06/2018), Disp: 30 tablet, Rfl: 3  Allergies  Allergen Reactions  . Penicillins Hives    Objective:   There were no vitals taken for this visit. AAOx3, NAD NCAT, EOMI No obvious CN deficits Coloring WNL Pt is able to speak clearly, coherently without shortness of breath or increased work of breathing.  Thought process is linear.  Mood is appropriate.   Assessment and Plan:   Hyperlipidemia- chronic problem.  Did not take the Lipitor as directed but admits that she's not doing anything to improve the situation.  Is not exercising, not following a particular diet.  Was not able to give me a recent weight.  Check labs and revisit the issue of medication.  Pt expressed understanding and is in agreement w/ plan.   Depression- chronic problem, currently stable.  No med changes at this time.  Will follow.   Annye Asa, MD 01/31/2019

## 2019-02-10 ENCOUNTER — Other Ambulatory Visit: Payer: Self-pay | Admitting: Family Medicine

## 2019-02-10 NOTE — Telephone Encounter (Signed)
This says #15 with 0. Ok to give more?

## 2019-03-27 ENCOUNTER — Ambulatory Visit: Payer: Managed Care, Other (non HMO) | Attending: Internal Medicine

## 2019-03-27 DIAGNOSIS — Z20822 Contact with and (suspected) exposure to covid-19: Secondary | ICD-10-CM

## 2019-03-29 LAB — NOVEL CORONAVIRUS, NAA: SARS-CoV-2, NAA: DETECTED — AB

## 2019-04-07 ENCOUNTER — Ambulatory Visit (HOSPITAL_COMMUNITY)
Admission: EM | Admit: 2019-04-07 | Discharge: 2019-04-07 | Disposition: A | Discharge status: Home / Self Care | Payer: Managed Care, Other (non HMO)

## 2019-04-07 ENCOUNTER — Other Ambulatory Visit: Payer: Self-pay

## 2019-04-07 NOTE — ED Notes (Signed)
Pt told registration she was leaving, she said she did not want to pay a co pay or see a provider to get a note.

## 2019-07-08 ENCOUNTER — Encounter: Payer: Self-pay | Admitting: Family Medicine

## 2019-07-10 MED ORDER — BUPROPION HCL ER (SR) 200 MG PO TB12: 200.0000 mg | ORAL_TABLET | Freq: Two times a day (BID) | ORAL | 1 refills | Status: DC

## 2019-07-10 MED ORDER — VENLAFAXINE HCL ER 150 MG PO CP24: 150.0000 mg | ORAL_CAPSULE | Freq: Every day | ORAL | 1 refills | Status: DC

## 2019-12-25 ENCOUNTER — Encounter: Payer: Self-pay | Admitting: Family Medicine

## 2019-12-25 ENCOUNTER — Other Ambulatory Visit: Payer: Self-pay | Admitting: Family Medicine

## 2019-12-25 MED ORDER — VENLAFAXINE HCL ER 150 MG PO CP24: 150.0000 mg | ORAL_CAPSULE | Freq: Every day | ORAL | 0 refills | Status: DC

## 2019-12-25 MED ORDER — BUPROPION HCL ER (SR) 200 MG PO TB12: 200.0000 mg | ORAL_TABLET | Freq: Two times a day (BID) | ORAL | 0 refills | Status: DC

## 2019-12-26 ENCOUNTER — Other Ambulatory Visit: Payer: Self-pay

## 2019-12-26 ENCOUNTER — Encounter: Payer: Self-pay | Admitting: Family Medicine

## 2019-12-26 ENCOUNTER — Telehealth (INDEPENDENT_AMBULATORY_CARE_PROVIDER_SITE_OTHER): Payer: Managed Care, Other (non HMO) | Admitting: Family Medicine

## 2019-12-26 DIAGNOSIS — E785 Hyperlipidemia, unspecified: Secondary | ICD-10-CM

## 2019-12-26 DIAGNOSIS — F331 Major depressive disorder, recurrent, moderate: Secondary | ICD-10-CM

## 2019-12-26 NOTE — Progress Notes (Signed)
Virtual Visit via Video   I connected with patient on 12/26/19 at 11:30 AM EDT by a video enabled telemedicine application and verified that I am speaking with the correct person using two identifiers.  Location patient: Home Location provider: Acupuncturist, Office Persons participating in the virtual visit: Patient, Provider, [place] (Jess B)  I discussed the limitations of evaluation and management by telemedicine and the availability of in person appointments. The patient expressed understanding and agreed to proceed.  Subjective:   HPI:   Depression- chronic problem, on Wellbutrin 200mg  daily and Effexor XR 150mg  daily.  PHQ=12 today.  Pt admits mood is low.  Wellbutrin is dosed BID but pt is only taking 1 tab daily.  Pt is very frustrated w/ her weight and how things are going.  Hyperlipidemia- never started Lipitor.  Is not exercising.  Labs from July 7 at [facility] show total cholesterol 243 and LDL 167.  Obesity- pt is frustrated w/ weight.  Not exercising.  Job is completely sedentary- drives all day.  When she gets home she is tired.  'mornings would be ideal but i'm not a morning person'.  Pt tried hormonal weight loss w/ [facility]- 'that didn't work for me'.  ROS:   See pertinent positives and negatives per HPI.  Patient Active Problem List   Diagnosis Date Noted  . Hyperlipidemia 12/11/2016  . Vitamin D deficiency 12/11/2016  . Routine general medical examination at a health care facility 01/20/2013  . Weight gain 07/03/2011  . TOBACCO USE 02/26/2008  . Depression 02/26/2008    Social History   Tobacco Use  . Smoking status: Current Every Day Smoker  . Smokeless tobacco: Never Used  Substance Use Topics  . Alcohol use: Yes    Current Outpatient Medications:  .  buPROPion (WELLBUTRIN SR) 200 MG 12 hr tablet, Take 1 tablet (200 mg total) by mouth 2 (two) times daily., Disp: 180 tablet, Rfl: 0 .  venlafaxine XR (EFFEXOR-XR) 150 MG 24 hr capsule, Take 1  capsule (150 mg total) by mouth daily with breakfast., Disp: 90 capsule, Rfl: 0 .  atorvastatin (LIPITOR) 20 MG tablet, Take 1 tablet (20 mg total) by mouth daily. (Patient not taking: Reported on 06/06/2018), Disp: 30 tablet, Rfl: 3 .  cholecalciferol (VITAMIN D) 1000 units tablet, Take 1,000 Units by mouth daily. (Patient not taking: Reported on 12/26/2019), Disp: , Rfl:   Allergies  Allergen Reactions  . Penicillins Hives    Objective:   There were no vitals taken for this visit. AAOx3, NAD, obese NCAT, EOMI No obvious CN deficits Coloring WNL Pt is able to speak clearly, coherently without shortness of breath or increased work of breathing.  Thought process is linear.  Mood is appropriate.   Assessment and Plan:   Depression- Deteriorated.  pt reports that her mood has a lot to do w/ her ongoing weight struggles.  Admits she is only taking her Wellbutrin once a day rather than twice as prescribed.  Encouraged twice daily dosing.  Pt is agreeable.  Hyperlipidemia- pt is not taking her statin as previously prescribed.  Her labs from July indicate that a statin is necessary but I will first give her an opportunity to work on [employer] and regular exercise.  In the meantime, we will add OTC Red Yeast Rice.  We will repeat these labs at her next visit to determine if prescription is needed.  Obesity- deteriorated.  Pt admits to not exercising and not following a particular  diet plan.  Discussed the value of following [employer] program and the need for regular exercise.  Encouraged her to try and get her exercise in the morning, prior to work, so it doesn't get forgotten during the day.  Will follow.  Annye Asa, MD 12/26/2019

## 2019-12-26 NOTE — Progress Notes (Signed)
I have discussed the procedure for the virtual visit with the patient who has given consent to proceed with assessment and treatment.   Pt unable to obtain vitals. No COVID, No flu, scored 11 on depression screen.   Davis Gourd, CMA

## 2020-02-06 ENCOUNTER — Ambulatory Visit: Payer: Managed Care, Other (non HMO) | Admitting: Family Medicine

## 2020-02-06 ENCOUNTER — Encounter: Payer: Self-pay | Admitting: Family Medicine

## 2020-02-06 ENCOUNTER — Other Ambulatory Visit: Payer: Self-pay

## 2020-02-06 VITALS — BP 130/70 | HR 68 | Temp 98.9°F | Resp 16 | Ht 69.0 in | Wt 227.6 lb

## 2020-02-06 DIAGNOSIS — L989 Disorder of the skin and subcutaneous tissue, unspecified: Secondary | ICD-10-CM

## 2020-02-06 DIAGNOSIS — E785 Hyperlipidemia, unspecified: Secondary | ICD-10-CM | POA: Diagnosis not present

## 2020-02-06 DIAGNOSIS — F331 Major depressive disorder, recurrent, moderate: Secondary | ICD-10-CM | POA: Diagnosis not present

## 2020-02-06 DIAGNOSIS — E669 Obesity, unspecified: Secondary | ICD-10-CM | POA: Insufficient documentation

## 2020-02-06 LAB — BASIC METABOLIC PANEL
BUN: 12 mg/dL (ref 6–23)
CO2: 26 mEq/L (ref 19–32)
Calcium: 9.3 mg/dL (ref 8.4–10.5)
Chloride: 101 mEq/L (ref 96–112)
Creatinine, Ser: 1.25 mg/dL — ABNORMAL HIGH (ref 0.40–1.20)
GFR: 51.24 mL/min — ABNORMAL LOW (ref >60.00)
Glucose, Bld: 84 mg/dL (ref 70–99)
Potassium: 4.2 mEq/L (ref 3.5–5.1)
Sodium: 135 mEq/L (ref 135–145)

## 2020-02-06 LAB — LIPID PANEL
Cholesterol: 223 mg/dL — ABNORMAL HIGH (ref 0–200)
HDL: 34.2 mg/dL — ABNORMAL LOW (ref >39.00)
LDL Cholesterol: 157 mg/dL — ABNORMAL HIGH (ref 0–99)
NonHDL: 188.42
Total CHOL/HDL Ratio: 7
Triglycerides: 156 mg/dL — ABNORMAL HIGH (ref 0.0–149.0)
VLDL: 31.2 mg/dL (ref 0.0–40.0)

## 2020-02-06 LAB — CBC WITH DIFFERENTIAL/PLATELET
Basophils Absolute: 0.1 10*3/uL (ref 0.0–0.1)
Basophils Relative: 1.3 % (ref 0.0–3.0)
Eosinophils Absolute: 0.2 10*3/uL (ref 0.0–0.7)
Eosinophils Relative: 2.5 % (ref 0.0–5.0)
HCT: 47.1 % — ABNORMAL HIGH (ref 36.0–46.0)
Hemoglobin: 16.1 g/dL — ABNORMAL HIGH (ref 12.0–15.0)
Lymphocytes Relative: 27.3 % (ref 12.0–46.0)
Lymphs Abs: 2.4 10*3/uL (ref 0.7–4.0)
MCHC: 34.2 g/dL (ref 30.0–36.0)
MCV: 88 fl (ref 78.0–100.0)
Monocytes Absolute: 0.5 10*3/uL (ref 0.1–1.0)
Monocytes Relative: 5.9 % (ref 3.0–12.0)
Neutro Abs: 5.6 10*3/uL (ref 1.4–7.7)
Neutrophils Relative %: 63 % (ref 43.0–77.0)
Platelets: 289 10*3/uL (ref 150.0–400.0)
RBC: 5.35 Mil/uL — ABNORMAL HIGH (ref 3.87–5.11)
RDW: 13.5 % (ref 11.5–15.5)
WBC: 8.9 10*3/uL (ref 4.0–10.5)

## 2020-02-06 LAB — HEPATIC FUNCTION PANEL
ALT: 15 U/L (ref 0–35)
AST: 14 U/L (ref 0–37)
Albumin: 4.3 g/dL (ref 3.5–5.2)
Alkaline Phosphatase: 112 U/L (ref 39–117)
Bilirubin, Direct: 0 mg/dL (ref 0.0–0.3)
Total Bilirubin: 0.3 mg/dL (ref 0.2–1.2)
Total Protein: 6.6 g/dL (ref 6.0–8.3)

## 2020-02-06 LAB — TSH: TSH: 1.02 u[IU]/mL (ref 0.35–4.50)

## 2020-02-06 MED ORDER — BUPROPION HCL ER (XL) 150 MG PO TB24: 150.0000 mg | ORAL_TABLET | Freq: Every day | ORAL | 1 refills | Status: DC

## 2020-02-06 MED ORDER — PHENTERMINE HCL 37.5 MG PO CAPS: 37.5000 mg | ORAL_CAPSULE | ORAL | 0 refills | Status: DC

## 2020-02-06 NOTE — Progress Notes (Signed)
° °  Subjective:    Patient ID: Angela Evans, female    DOB: 1970-07-02, 49 y.o.   MRN: [id number]  HPI Depression- ongoing issue for pt.  Currently on Venlafaxine 150mg  daily and Wellbutrin SR 200mg  BID.  She is still only taking Wellbutrin once daily.  Pt reports mood is better despite not changing anything.  Pt is open to idea of extended release.    Obesity- pt's BMI is now 33.61 and she has gained 11 lbs since last in-office visit.  She is interested in [place] or Ozempic.  Pt has used phentermine successfully in the past  Hyperlipidemia- chronic problem.  Not taking Atorvastatin.  Not exercising regularly.  Has been taking Red Yeast Rice   Review of Systems For ROS see HPI   This visit occurred during the SARS-CoV-2 public health emergency.  Safety protocols were in place, including screening questions prior to the visit, additional usage of staff PPE, and extensive cleaning of exam room while observing appropriate contact time as indicated for disinfecting solutions.       Objective:   Physical Exam Vitals reviewed.  Constitutional:      General: She is not in acute distress.    Appearance: Normal appearance. She is well-developed. She is obese.  HENT:     Head: Normocephalic and atraumatic.  Eyes:     Conjunctiva/sclera: Conjunctivae normal.     Pupils: Pupils are equal, round, and reactive to light.  Neck:     Thyroid: No thyromegaly.  Cardiovascular:     Rate and Rhythm: Normal rate and regular rhythm.     Heart sounds: Normal heart sounds. No murmur heard.   Pulmonary:     Effort: Pulmonary effort is normal. No respiratory distress.     Breath sounds: Normal breath sounds.  Abdominal:     General: There is no distension.     Palpations: Abdomen is soft.     Tenderness: There is no abdominal tenderness.  Musculoskeletal:     Cervical back: Normal range of motion and neck supple.  Lymphadenopathy:     Cervical: No cervical adenopathy.  Skin:    General: Skin  is warm and dry.  Neurological:     Mental Status: She is alert and oriented to person, place, and time.  Psychiatric:        Behavior: Behavior normal.           Assessment & Plan:

## 2020-02-06 NOTE — Assessment & Plan Note (Signed)
Ongoing issue for pt.  Since she has not been able to successfully increase the Wellbutrin to twice daily (she forgets the afternoon pill) we will switch to extended release formulation.  Pt expressed understanding and is in agreement w/ plan.

## 2020-02-06 NOTE — Assessment & Plan Note (Signed)
Chronic problem.  Taking OTC Red Yeast Rice rather than prescription lipitor at this time.  Check labs.  Adjust meds prn

## 2020-02-06 NOTE — Patient Instructions (Signed)
Follow up in 6-8 weeks to recheck mood and weight loss progress We'll notify you of your lab results and make any changes if needed Continue to work on healthy diet and regular exercise- you can do it!! Switch to the extended release Wellbutrin for mood START the Phentermine daily to help w/ weight loss.  This is in addition to healthy diet and regular exercise Call with any questions or concerns Stay Safe!  Stay Healthy! Happy Holidays!

## 2020-02-06 NOTE — Assessment & Plan Note (Signed)
Ongoing issue for pt.  She is interested in weight loss medication.  Discussed that [place] and Saxenda have poor insurance coverage on most plans and tend to be very expensive.  They must also be used in addition to healthy diet and regular exercise.  She was successful on Phentermine in the past and would like to restart this to curb her appetite during the holidays.  Will continue to follow.

## 2020-02-09 ENCOUNTER — Encounter: Payer: Self-pay | Admitting: Family Medicine

## 2020-02-09 ENCOUNTER — Other Ambulatory Visit: Payer: Self-pay

## 2020-02-09 DIAGNOSIS — E785 Hyperlipidemia, unspecified: Secondary | ICD-10-CM

## 2020-02-09 MED ORDER — ATORVASTATIN CALCIUM 20 MG PO TABS: 20.0000 mg | ORAL_TABLET | Freq: Every day | ORAL | 0 refills | Status: DC

## 2020-02-10 MED ORDER — PHENTERMINE HCL 37.5 MG PO CAPS
37.5000 mg | ORAL_CAPSULE | ORAL | 0 refills | Status: DC
Start: 2020-02-10 — End: 2020-07-27

## 2020-03-25 ENCOUNTER — Ambulatory Visit: Payer: Managed Care, Other (non HMO) | Admitting: Family Medicine

## 2020-03-25 ENCOUNTER — Other Ambulatory Visit: Payer: Self-pay

## 2020-03-25 ENCOUNTER — Encounter: Payer: Self-pay | Admitting: Family Medicine

## 2020-03-25 VITALS — BP 120/70 | HR 72 | Temp 99.1°F | Resp 16 | Ht 69.0 in | Wt 228.2 lb

## 2020-03-25 DIAGNOSIS — R7989 Other specified abnormal findings of blood chemistry: Secondary | ICD-10-CM

## 2020-03-25 DIAGNOSIS — F331 Major depressive disorder, recurrent, moderate: Secondary | ICD-10-CM

## 2020-03-25 DIAGNOSIS — E669 Obesity, unspecified: Secondary | ICD-10-CM | POA: Diagnosis not present

## 2020-03-25 NOTE — Progress Notes (Signed)
   Subjective:    Patient ID: Angela Evans, female    DOB: [birth year], 50 y.o.   MRN: [id number]  HPI Depression- ongoing issue for pt.  On Wellburin 150mg  daily, Venlafaxine 150mg  daily.  'it's good'.  Less sad, less tearful.  Continues to have stress w/ aging parents.  Pt is not interested in adjusting meds at this time.  Obesity- pt's weight is stable since 11/19.  She made it through Thanksgiving, Christmas, and New Year's w/o weight gain.  She was started on Phentermine 37.5mg  at last visit. Pt reports feeling somewhat better.  Elevated Cr- last reading was 1.25.  Pt reports being dehydrated today w/ very dry mouth.   Review of Systems For ROS see HPI   This visit occurred during the SARS-CoV-2 public health emergency.  Safety protocols were in place, including screening questions prior to the visit, additional usage of staff PPE, and extensive cleaning of exam room while observing appropriate contact time as indicated for disinfecting solutions.       Objective:   Physical Exam Vitals reviewed.  Constitutional:      General: She is not in acute distress.    Appearance: Normal appearance. She is obese. She is not ill-appearing or toxic-appearing.  HENT:     Head: Normocephalic and atraumatic.  Eyes:     Extraocular Movements: Extraocular movements intact.     Conjunctiva/sclera: Conjunctivae normal.     Pupils: Pupils are equal, round, and reactive to light.  Skin:    General: Skin is warm and dry.  Neurological:     General: No focal deficit present.     Mental Status: She is alert and oriented to person, place, and time.  Psychiatric:        Mood and Affect: Mood normal.        Behavior: Behavior normal.        Thought Content: Thought content normal.           Assessment & Plan:  Elevated Cr- pt's last BMP showed Cr of 1.25.  We discussed possible causes for this- most common being mild dehydration.  Pt reports she is definitely dehydrated today as she has  not had anything to drink.  Will not repeat under those circumstances but will follow.

## 2020-03-25 NOTE — Assessment & Plan Note (Signed)
Improved w/ change to Wellbutrin extended release.  She remains on Venlafaxine 150mg  daily.  Not interested in changing meds at this time.  Will follow.

## 2020-03-25 NOTE — Assessment & Plan Note (Signed)
Weight is stable since last visit.  Applauded this as she was able to make it through the holiday season w/o any weight gain.  No side effects from Phentermine- no palpitations, anxiety, HTN.  No changes at this time.  Will follow.

## 2020-03-25 NOTE — Patient Instructions (Signed)
Schedule your complete physical for March Drink LOTS of fluids Continue to work on healthy diet and regular exercise- you can do it! Call with any questions or concerns Stay Safe!  Stay Healthy! Happy New Year!!!

## 2020-04-02 ENCOUNTER — Ambulatory Visit: Payer: Managed Care, Other (non HMO) | Admitting: Family Medicine

## 2020-04-15 ENCOUNTER — Other Ambulatory Visit: Payer: Self-pay | Admitting: Family Medicine

## 2020-05-12 ENCOUNTER — Other Ambulatory Visit: Payer: Self-pay | Admitting: Family Medicine

## 2020-05-12 ENCOUNTER — Encounter: Payer: Self-pay | Admitting: Family Medicine

## 2020-05-12 DIAGNOSIS — E785 Hyperlipidemia, unspecified: Secondary | ICD-10-CM

## 2020-05-12 DIAGNOSIS — F331 Major depressive disorder, recurrent, moderate: Secondary | ICD-10-CM

## 2020-05-12 MED ORDER — BUPROPION HCL ER (XL) 150 MG PO TB24: 150.0000 mg | ORAL_TABLET | Freq: Every day | ORAL | 2 refills | Status: DC

## 2020-06-01 ENCOUNTER — Encounter: Payer: Managed Care, Other (non HMO) | Admitting: Family Medicine

## 2020-06-25 ENCOUNTER — Other Ambulatory Visit: Payer: Self-pay

## 2020-06-25 ENCOUNTER — Encounter: Payer: Self-pay | Admitting: Family Medicine

## 2020-06-25 ENCOUNTER — Ambulatory Visit (INDEPENDENT_AMBULATORY_CARE_PROVIDER_SITE_OTHER): Payer: Managed Care, Other (non HMO) | Admitting: Family Medicine

## 2020-06-25 VITALS — BP 118/80 | HR 64 | Temp 98.2°F | Resp 20 | Ht 68.5 in | Wt 222.2 lb

## 2020-06-25 DIAGNOSIS — E559 Vitamin D deficiency, unspecified: Secondary | ICD-10-CM | POA: Diagnosis not present

## 2020-06-25 DIAGNOSIS — Z1211 Encounter for screening for malignant neoplasm of colon: Secondary | ICD-10-CM

## 2020-06-25 DIAGNOSIS — E669 Obesity, unspecified: Secondary | ICD-10-CM

## 2020-06-25 DIAGNOSIS — R350 Frequency of micturition: Secondary | ICD-10-CM

## 2020-06-25 DIAGNOSIS — Z0000 Encounter for general adult medical examination without abnormal findings: Secondary | ICD-10-CM | POA: Diagnosis not present

## 2020-06-25 LAB — LIPID PANEL
Cholesterol: 116 mg/dL (ref 0–200)
HDL: 30.2 mg/dL — ABNORMAL LOW (ref >39.00)
LDL Cholesterol: 67 mg/dL (ref 0–99)
NonHDL: 85.96
Total CHOL/HDL Ratio: 4
Triglycerides: 97 mg/dL (ref 0.0–149.0)
VLDL: 19.4 mg/dL (ref 0.0–40.0)

## 2020-06-25 LAB — CBC WITH DIFFERENTIAL/PLATELET
Basophils Absolute: 0.1 10*3/uL (ref 0.0–0.1)
Basophils Relative: 1.8 % (ref 0.0–3.0)
Eosinophils Absolute: 0.2 10*3/uL (ref 0.0–0.7)
Eosinophils Relative: 2.5 % (ref 0.0–5.0)
HCT: 44.7 % (ref 36.0–46.0)
Hemoglobin: 15.6 g/dL — ABNORMAL HIGH (ref 12.0–15.0)
Lymphocytes Relative: 30.8 % (ref 12.0–46.0)
Lymphs Abs: 2.1 10*3/uL (ref 0.7–4.0)
MCHC: 35 g/dL (ref 30.0–36.0)
MCV: 86.9 fl (ref 78.0–100.0)
Monocytes Absolute: 0.4 10*3/uL (ref 0.1–1.0)
Monocytes Relative: 5.7 % (ref 3.0–12.0)
Neutro Abs: 4.1 10*3/uL (ref 1.4–7.7)
Neutrophils Relative %: 59.2 % (ref 43.0–77.0)
Platelets: 288 10*3/uL (ref 150.0–400.0)
RBC: 5.14 Mil/uL — ABNORMAL HIGH (ref 3.87–5.11)
RDW: 12.8 % (ref 11.5–15.5)
WBC: 6.9 10*3/uL (ref 4.0–10.5)

## 2020-06-25 LAB — POCT URINALYSIS DIPSTICK
Bilirubin, UA: NEGATIVE
Blood, UA: NEGATIVE
Glucose, UA: NEGATIVE
Ketones, UA: NEGATIVE
Leukocytes, UA: NEGATIVE
Nitrite, UA: NEGATIVE
Protein, UA: NEGATIVE
Spec Grav, UA: 1.015 (ref 1.010–1.025)
Urobilinogen, UA: 0.2 E.U./dL
pH, UA: 6 (ref 5.0–8.0)

## 2020-06-25 LAB — BASIC METABOLIC PANEL
BUN: 17 mg/dL (ref 6–23)
CO2: 23 mEq/L (ref 19–32)
Calcium: 9 mg/dL (ref 8.4–10.5)
Chloride: 103 mEq/L (ref 96–112)
Creatinine, Ser: 1.11 mg/dL (ref 0.40–1.20)
GFR: 58.93 mL/min — ABNORMAL LOW (ref >60.00)
Glucose, Bld: 97 mg/dL (ref 70–99)
Potassium: 3.9 mEq/L (ref 3.5–5.1)
Sodium: 132 mEq/L — ABNORMAL LOW (ref 135–145)

## 2020-06-25 LAB — HEPATIC FUNCTION PANEL
ALT: 17 U/L (ref 0–35)
AST: 14 U/L (ref 0–37)
Albumin: 4.3 g/dL (ref 3.5–5.2)
Alkaline Phosphatase: 126 U/L — ABNORMAL HIGH (ref 39–117)
Bilirubin, Direct: 0.1 mg/dL (ref 0.0–0.3)
Total Bilirubin: 0.3 mg/dL (ref 0.2–1.2)
Total Protein: 6.7 g/dL (ref 6.0–8.3)

## 2020-06-25 LAB — TSH: TSH: 1.68 u[IU]/mL (ref 0.35–4.50)

## 2020-06-25 LAB — VITAMIN D 25 HYDROXY (VIT D DEFICIENCY, FRACTURES): VITD: 21.03 ng/mL — ABNORMAL LOW (ref 30.00–100.00)

## 2020-06-25 MED ORDER — VITAMIN D (ERGOCALCIFEROL) 1.25 MG (50000 UNIT) PO CAPS: 50000.0000 [IU] | ORAL_CAPSULE | ORAL | 0 refills | Status: DC

## 2020-06-25 NOTE — Assessment & Plan Note (Signed)
Pt has hx of this.  Check labs and replete prn. 

## 2020-06-25 NOTE — Assessment & Plan Note (Signed)
Pt's PE WNL w/ exception of obesity.  UTD on pap, mammo, immunizations.  Referral placed for colon cancer screen.  Check labs.  Anticipatory guidance provided.

## 2020-06-25 NOTE — Progress Notes (Signed)
   Subjective:    Patient ID: Angela Evans, female    DOB: [birth year], 50 y.o.   MRN: [id number]  HPI CPE- UTD on Tdap, COVID, mammo, pap.  Due to start colon cancer screening  Reviewed past medical, surgical, family and social histories.   Patient Care Team    Relationship Specialty Notifications Start End  Midge Minium, MD PCP - General   03/02/10   Linda Hedges, DO Consulting Physician Obstetrics and Gynecology  04/23/15     Health Maintenance  Topic Date Due  . PAP SMEAR-Modifier  02/28/2020  . COLONOSCOPY (Pts 45-44yrs Insurance coverage will need to be confirmed)  06/29/2020 (Originally 05/03/2017)  . Hepatitis C Screening  12/25/2020 (Originally 1971/02/08)  . HIV Screening  02/05/2021 (Originally 05/04/1987)  . COVID-19 Vaccine (3 - Booster for Pfizer series) 09/07/2020  . INFLUENZA VACCINE  10/18/2020  . MAMMOGRAM  12/23/2020  . TETANUS/TDAP  06/24/2025  . HPV VACCINES  Aged Out      Review of Systems Patient reports no vision/ hearing changes, adenopathy,fever, persistant/recurrent hoarseness , swallowing issues, chest pain, palpitations, edema, persistant/recurrent cough, hemoptysis, dyspnea (rest/exertional/paroxysmal nocturnal), gastrointestinal bleeding (melena, rectal bleeding), abdominal pain, significant heartburn, bowel changes, Gyn symptoms (abnormal  bleeding, pain),  syncope, focal weakness, memory loss, numbness & tingling, skin/hair/nail changes, abnormal bruising or bleeding, anxiety, or depression.   + 6 lb weight loss + urinary frequency  This visit occurred during the SARS-CoV-2 public health emergency.  Safety protocols were in place, including screening questions prior to the visit, additional usage of staff PPE, and extensive cleaning of exam room while observing appropriate contact time as indicated for disinfecting solutions.       Objective:   Physical Exam General Appearance:    Alert, cooperative, no distress, appears stated age   Head:    Normocephalic, without obvious abnormality, atraumatic  Eyes:    PERRL, conjunctiva/corneas clear, EOM's intact, fundi    benign, both eyes  Ears:    Normal TM's and external ear canals, both ears  Nose:   Deferred due to COVID  Throat:   Neck:   Supple, symmetrical, trachea midline, no adenopathy;    Thyroid: no enlargement/tenderness/nodules  Back:     Symmetric, no curvature, ROM normal, no CVA tenderness  Lungs:     Clear to auscultation bilaterally, respirations unlabored  Chest Wall:    No tenderness or deformity   Heart:    Regular rate and rhythm, S1 and S2 normal, no murmur, rub   or gallop  Breast Exam:    Deferred to GYN  Abdomen:     Soft, non-tender, bowel sounds active all four quadrants,    no masses, no organomegaly  Genitalia:    Deferred to GYN  Rectal:    Extremities:   Extremities normal, atraumatic, no cyanosis or edema  Pulses:   2+ and symmetric all extremities  Skin:   Skin color, texture, turgor normal, no rashes or lesions  Lymph nodes:   Cervical, supraclavicular, and axillary nodes normal  Neurologic:   CNII-XII intact, normal strength, sensation and reflexes    throughout          Assessment & Plan:

## 2020-06-25 NOTE — Assessment & Plan Note (Signed)
Pt is down 6 lbs.  She is not using the Phentermine but is counting calories.  Applauded her efforts.  Check labs to risk stratify.

## 2020-06-25 NOTE — Patient Instructions (Addendum)
Follow up in 6 months to recheck cholesterol We'll notify you of your lab results and make any changes if needed Continue to work on healthy diet and regular exercise- you're doing great!! We'll call you with your GI appt for colon cancer screening consultation Call with any questions or concerns Happy Spring!!!

## 2020-07-06 ENCOUNTER — Encounter: Payer: Self-pay | Admitting: Gastroenterology

## 2020-07-14 ENCOUNTER — Ambulatory Visit (AMBULATORY_SURGERY_CENTER): Payer: Self-pay

## 2020-07-14 ENCOUNTER — Other Ambulatory Visit: Payer: Self-pay

## 2020-07-14 VITALS — Ht 68.5 in | Wt 217.0 lb

## 2020-07-14 DIAGNOSIS — Z1211 Encounter for screening for malignant neoplasm of colon: Secondary | ICD-10-CM

## 2020-07-14 MED ORDER — NA SULFATE-K SULFATE-MG SULF 17.5-3.13-1.6 GM/177ML PO SOLN: 1.0000 | Freq: Once | ORAL | 0 refills | Status: AC

## 2020-07-14 NOTE — Progress Notes (Signed)
No allergies to soy or egg Pt is not on blood thinners or diet pills-denies taking Phentermine. Denies any surgeries/Procedures unable to assess sedation/intubation status Denies atrial flutter/fib Denies constipation   Emmi instructions given to pt  Pt is aware of Covid safety and care partner requirements.

## 2020-07-26 ENCOUNTER — Encounter: Payer: Self-pay | Admitting: Gastroenterology

## 2020-07-27 ENCOUNTER — Encounter: Payer: Self-pay | Admitting: Gastroenterology

## 2020-07-27 ENCOUNTER — Other Ambulatory Visit: Payer: Self-pay | Admitting: Gastroenterology

## 2020-07-27 ENCOUNTER — Other Ambulatory Visit: Payer: Self-pay

## 2020-07-27 ENCOUNTER — Ambulatory Visit (AMBULATORY_SURGERY_CENTER): Payer: Managed Care, Other (non HMO) | Admitting: Gastroenterology

## 2020-07-27 VITALS — BP 126/74 | HR 54 | Temp 98.6°F | Resp 20 | Ht 68.0 in | Wt 217.0 lb

## 2020-07-27 DIAGNOSIS — D126 Benign neoplasm of colon, unspecified: Secondary | ICD-10-CM

## 2020-07-27 DIAGNOSIS — D123 Benign neoplasm of transverse colon: Secondary | ICD-10-CM

## 2020-07-27 DIAGNOSIS — D120 Benign neoplasm of cecum: Secondary | ICD-10-CM | POA: Diagnosis not present

## 2020-07-27 DIAGNOSIS — Z1211 Encounter for screening for malignant neoplasm of colon: Secondary | ICD-10-CM

## 2020-07-27 MED ORDER — SODIUM CHLORIDE 0.9 % IV SOLN: 500.0000 mL | Freq: Once | INTRAVENOUS | Status: DC

## 2020-07-27 NOTE — Patient Instructions (Signed)
Handouts provided on polyps, hemorrhoids and high-fiber diet.   Recommend high-fiber diet.   No aspirin, ibuprofen, naproxen, or other non-steriodal anti-inflammatory drugs (NSAIDs) for 2 weeks after polyp removal.   Repeat colonoscopy in 3 years for surveillance.   YOU HAD AN ENDOSCOPIC PROCEDURE TODAY AT [facility]:   Refer to the procedure report that was given to you for any specific questions about what was found during the examination.  If the procedure report does not answer your questions, please call your gastroenterologist to clarify.  If you requested that your care partner not be given the details of your procedure findings, then the procedure report has been included in a sealed envelope for you to review at your convenience later.  YOU SHOULD EXPECT: Some feelings of bloating in the abdomen. Passage of more gas than usual.  Walking can help get rid of the air that was put into your GI tract during the procedure and reduce the bloating. If you had a lower endoscopy (such as a colonoscopy or flexible sigmoidoscopy) you may notice spotting of blood in your stool or on the toilet paper. If you underwent a bowel prep for your procedure, you may not have a normal bowel movement for a few days.  Please Note:  You might notice some irritation and congestion in your nose or some drainage.  This is from the oxygen used during your procedure.  There is no need for concern and it should clear up in a day or so.  SYMPTOMS TO REPORT IMMEDIATELY:   Following lower endoscopy (colonoscopy or flexible sigmoidoscopy):  Excessive amounts of blood in the stool  Significant tenderness or worsening of abdominal pains  Swelling of the abdomen that is new, acute  Fever of 100F or higher  For urgent or emergent issues, a gastroenterologist can be reached at any hour by calling [phone]. Do not use MyChart messaging for urgent concerns.    DIET:  We do recommend a small meal at  first, but then you may proceed to your regular diet.  Drink plenty of fluids but you should avoid alcoholic beverages for 24 hours.  ACTIVITY:  You should plan to take it easy for the rest of today and you should NOT DRIVE or use heavy machinery until tomorrow (because of the sedation medicines used during the test).    FOLLOW UP: Our staff will call the number listed on your records 48-72 hours following your procedure to check on you and address any questions or concerns that you may have regarding the information given to you following your procedure. If we do not reach you, we will leave a message.  We will attempt to reach you two times.  During this call, we will ask if you have developed any symptoms of COVID 19. If you develop any symptoms (ie: fever, flu-like symptoms, shortness of breath, cough etc.) before then, please call [phone].  If you test positive for Covid 19 in the 2 weeks post procedure, please call and report this information to [place].    If any biopsies were taken you will be contacted by phone or by letter within the next 1-3 weeks.  Please call us at [phone] if you have not heard about the biopsies in 3 weeks.    SIGNATURES/CONFIDENTIALITY: You and/or your care partner have signed paperwork which will be entered into your electronic medical record.  These signatures attest to the fact that that the information above on your After Visit  Summary has been reviewed and is understood.  Full responsibility of the confidentiality of this discharge information lies with you and/or your care-partner.

## 2020-07-27 NOTE — Progress Notes (Signed)
VS by CW  I have reviewed the patient's medical history in detail and updated the computerized patient record.  

## 2020-07-27 NOTE — Progress Notes (Signed)
Called to room to assist during endoscopic procedure.  Patient ID and intended procedure confirmed with present staff. Received instructions for my participation in the procedure from the performing physician.  

## 2020-07-27 NOTE — Op Note (Signed)
[facility] Patient Name: Angela Evans Procedure Date: 07/27/2020 10:30 AM MRN: [id number] Endoscopist: Justice Britain , MD Age: 50 Referring MD:  Date of Birth: 11-02-1970 Gender: Female Account #: [id number] Procedure:                Colonoscopy Indications:              Screening for colorectal malignant neoplasm, This                            is the patient's first colonoscopy Medicines:                Monitored Anesthesia Care Procedure:                Pre-Anesthesia Assessment:                           - Prior to the procedure, a History and Physical                            was performed, and patient medications and                            allergies were reviewed. The patient's tolerance of                            previous anesthesia was also reviewed. The risks                            and benefits of the procedure and the sedation                            options and risks were discussed with the patient.                            All questions were answered, and informed consent                            was obtained. Prior Anticoagulants: The patient has                            taken no previous anticoagulant or antiplatelet                            agents. ASA Grade Assessment: II - A patient with                            mild systemic disease. After reviewing the risks                            and benefits, the patient was deemed in                            satisfactory condition to undergo the procedure.  After obtaining informed consent, the colonoscope                            was passed under direct vision. Throughout the                            procedure, the patient's blood pressure, pulse, and                            oxygen saturations were monitored continuously. The                            Olympus CF-HQ190L [id number]) Colonoscope was                            introduced through the  anus and advanced to the 5                            cm into the ileum. The colonoscopy was performed                            without difficulty. The patient tolerated the                            procedure. The quality of the bowel preparation was                            good. The terminal ileum, ileocecal valve,                            appendiceal orifice, and rectum were photographed. Scope In: 10:37:20 AM Scope Out: 10:56:27 AM Scope Withdrawal Time: 0 hours 16 minutes 3 seconds  Total Procedure Duration: 0 hours 19 minutes 7 seconds  Findings:                 The digital rectal exam findings include                            hemorrhoids. Pertinent negatives include no                            palpable rectal lesions.                           The terminal ileum and ileocecal valve appeared                            normal.                           Three sessile polyps were found in the transverse                            colon, hepatic flexure and cecum. The polyps were 3  to 14 mm in size (the 2 largest were 12 mm and 14                            mm in size and found in HF & TC). These polyps were                            removed with a cold snare. Resection and retrieval                            were complete.                           Normal mucosa was found in the entire colon                            otherwise.                           Non-bleeding non-thrombosed external and internal                            hemorrhoids were found during retroflexion, during                            perianal exam and during digital exam. The                            hemorrhoids were Grade II (internal hemorrhoids                            that prolapse but reduce spontaneously). Complications:            No immediate complications. Estimated Blood Loss:     Estimated blood loss was minimal. Impression:               - Hemorrhoids  found on digital rectal exam.                           - The examined portion of the ileum was normal.                           - Three 3 to 14 mm polyps in the transverse colon,                            at the hepatic flexure and in the cecum, removed                            with a cold snare. Resected and retrieved.                           - Normal mucosa in the entire examined colon                            otherwise.                           -  Non-bleeding non-thrombosed external and internal                            hemorrhoids. Recommendation:           - The patient will be observed post-procedure,                            until all discharge criteria are met.                           - Discharge patient to home.                           - Patient has a contact number available for                            emergencies. The signs and symptoms of potential                            delayed complications were discussed with the                            patient. Return to normal activities tomorrow.                            Written discharge instructions were provided to the                            patient.                           - High fiber diet.                           - Continue present medications.                           - No aspirin, ibuprofen, naproxen, or other                            non-steroidal anti-inflammatory drugs for 2 weeks                            after polyp removal.                           - Await pathology results.                           - Repeat colonoscopy in 3 years for surveillance.                           - The findings and recommendations were discussed                            with the patient. Justice Britain, MD 07/27/2020 11:02:44 AM

## 2020-07-27 NOTE — Progress Notes (Signed)
pt tolerated well. VSS. awake and to recovery. Report given to RN.  

## 2020-07-29 ENCOUNTER — Telehealth: Payer: Self-pay | Admitting: *Deleted

## 2020-07-29 NOTE — Telephone Encounter (Signed)
  Follow up Call-  Call back number 07/27/2020  Post procedure Call Back phone  # [phone]  Permission to leave phone message Yes  Some recent data might be hidden   [facility]

## 2020-07-29 NOTE — Telephone Encounter (Signed)
  Follow up Call-  Call back number 07/27/2020  Post procedure Call Back phone  # [phone]  Permission to leave phone message Yes  Some recent data might be hidden     Patient questions:  Do you have a fever, pain , or abdominal swelling? No. Pain Score  0 *  Have you tolerated food without any problems? Yes.    Have you been able to return to your normal activities? Yes.    Do you have any questions about your discharge instructions: Diet   No. Medications  No. Follow up visit  No.  Do you have questions or concerns about your Care? No.  Actions: * If pain score is 4 or above: No action needed, pain <4.  1. Have you developed a fever since your procedure? no  2.   Have you had an respiratory symptoms (SOB or cough) since your procedure? no  3.   Have you tested positive for COVID 19 since your procedure no  4.   Have you had any family members/close contacts diagnosed with the COVID 19 since your procedure?  no   If yes to any of these questions please route to Joylene John, RN and Joella Prince, RN

## 2020-08-09 ENCOUNTER — Encounter: Payer: Self-pay | Admitting: Gastroenterology

## 2020-09-15 ENCOUNTER — Encounter: Payer: Self-pay | Admitting: *Deleted

## 2020-12-22 ENCOUNTER — Ambulatory Visit: Payer: Managed Care, Other (non HMO) | Admitting: Family Medicine

## 2021-01-13 ENCOUNTER — Encounter: Payer: Self-pay | Admitting: Family Medicine

## 2021-01-13 ENCOUNTER — Other Ambulatory Visit: Payer: Self-pay

## 2021-01-13 ENCOUNTER — Ambulatory Visit (INDEPENDENT_AMBULATORY_CARE_PROVIDER_SITE_OTHER): Payer: Managed Care, Other (non HMO) | Admitting: Family Medicine

## 2021-01-13 VITALS — BP 120/70 | HR 70 | Temp 98.2°F | Resp 20 | Ht 68.0 in | Wt 209.2 lb

## 2021-01-13 DIAGNOSIS — R350 Frequency of micturition: Secondary | ICD-10-CM | POA: Diagnosis not present

## 2021-01-13 DIAGNOSIS — E785 Hyperlipidemia, unspecified: Secondary | ICD-10-CM

## 2021-01-13 DIAGNOSIS — E669 Obesity, unspecified: Secondary | ICD-10-CM

## 2021-01-13 DIAGNOSIS — F331 Major depressive disorder, recurrent, moderate: Secondary | ICD-10-CM

## 2021-01-13 LAB — LIPID PANEL
Cholesterol: 175 mg/dL (ref 0–200)
HDL: 42.2 mg/dL (ref >39.00)
LDL Cholesterol: 112 mg/dL — ABNORMAL HIGH (ref 0–99)
NonHDL: 132.94
Total CHOL/HDL Ratio: 4
Triglycerides: 103 mg/dL (ref 0.0–149.0)
VLDL: 20.6 mg/dL (ref 0.0–40.0)

## 2021-01-13 LAB — POCT URINALYSIS DIPSTICK
Bilirubin, UA: NEGATIVE
Blood, UA: NEGATIVE
Glucose, UA: NEGATIVE
Ketones, UA: NEGATIVE
Leukocytes, UA: NEGATIVE
Nitrite, UA: NEGATIVE
Protein, UA: NEGATIVE
Spec Grav, UA: 1.01 (ref 1.010–1.025)
Urobilinogen, UA: 0.2 E.U./dL
pH, UA: 5.5 (ref 5.0–8.0)

## 2021-01-13 LAB — CBC WITH DIFFERENTIAL/PLATELET
Basophils Absolute: 0.1 10*3/uL (ref 0.0–0.1)
Basophils Relative: 1.1 % (ref 0.0–3.0)
Eosinophils Absolute: 0.2 10*3/uL (ref 0.0–0.7)
Eosinophils Relative: 2.8 % (ref 0.0–5.0)
HCT: 45.5 % (ref 36.0–46.0)
Hemoglobin: 15.5 g/dL — ABNORMAL HIGH (ref 12.0–15.0)
Lymphocytes Relative: 30.7 % (ref 12.0–46.0)
Lymphs Abs: 2.3 10*3/uL (ref 0.7–4.0)
MCHC: 34.1 g/dL (ref 30.0–36.0)
MCV: 88.6 fl (ref 78.0–100.0)
Monocytes Absolute: 0.4 10*3/uL (ref 0.1–1.0)
Monocytes Relative: 5.7 % (ref 3.0–12.0)
Neutro Abs: 4.5 10*3/uL (ref 1.4–7.7)
Neutrophils Relative %: 59.7 % (ref 43.0–77.0)
Platelets: 288 10*3/uL (ref 150.0–400.0)
RBC: 5.13 Mil/uL — ABNORMAL HIGH (ref 3.87–5.11)
RDW: 13.1 % (ref 11.5–15.5)
WBC: 7.6 10*3/uL (ref 4.0–10.5)

## 2021-01-13 LAB — BASIC METABOLIC PANEL
BUN: 10 mg/dL (ref 6–23)
CO2: 23 mEq/L (ref 19–32)
Calcium: 9.1 mg/dL (ref 8.4–10.5)
Chloride: 104 mEq/L (ref 96–112)
Creatinine, Ser: 0.99 mg/dL (ref 0.40–1.20)
GFR: 67.34 mL/min (ref >60.00)
Glucose, Bld: 89 mg/dL (ref 70–99)
Potassium: 4.1 mEq/L (ref 3.5–5.1)
Sodium: 135 mEq/L (ref 135–145)

## 2021-01-13 LAB — HEPATIC FUNCTION PANEL
ALT: 24 U/L (ref 0–35)
AST: 18 U/L (ref 0–37)
Albumin: 4.3 g/dL (ref 3.5–5.2)
Alkaline Phosphatase: 152 U/L — ABNORMAL HIGH (ref 39–117)
Bilirubin, Direct: 0.1 mg/dL (ref 0.0–0.3)
Total Bilirubin: 0.4 mg/dL (ref 0.2–1.2)
Total Protein: 6.7 g/dL (ref 6.0–8.3)

## 2021-01-13 LAB — TSH: TSH: 1.54 u[IU]/mL (ref 0.35–5.50)

## 2021-01-13 MED ORDER — ATORVASTATIN CALCIUM 20 MG PO TABS: 20.0000 mg | ORAL_TABLET | Freq: Every day | ORAL | 1 refills | Status: DC

## 2021-01-13 MED ORDER — BUPROPION HCL ER (XL) 300 MG PO TB24: 300.0000 mg | ORAL_TABLET | Freq: Every day | ORAL | 1 refills | Status: DC

## 2021-01-13 NOTE — Progress Notes (Signed)
   Subjective:    Patient ID: Angela Evans, female    DOB: [birth year], 50 y.o.   MRN: [id number]  HPI Hyperlipidemia- chronic problem, on Lipitor 20mg  daily.  Denies CP, SOB, abd pain, N/V.  Obesity- pt has lost 13 lbs since last visit.  She is not eating regularly and is snacking throughout the day.  No regular exercise  Depression- chronic problem, on Wellbutrin 150mg  daily and Venflafaxine 150mg .  Feels that medication is not as effective as it was.  Dad has been placed in nursing home and mom is starting to deteriorate as well.  Urinary frequency- sxs started a few months ago.  Pt has to hold her bladder for extended periods of time due to work.  Review of Systems For ROS see HPI   This visit occurred during the SARS-CoV-2 public health emergency.  Safety protocols were in place, including screening questions prior to the visit, additional usage of staff PPE, and extensive cleaning of exam room while observing appropriate contact time as indicated for disinfecting solutions.      Objective:   Physical Exam Vitals reviewed.  Constitutional:      General: She is not in acute distress.    Appearance: Normal appearance. She is well-developed. She is not ill-appearing.  HENT:     Head: Normocephalic and atraumatic.  Eyes:     Conjunctiva/sclera: Conjunctivae normal.     Pupils: Pupils are equal, round, and reactive to light.  Neck:     Thyroid: No thyromegaly.  Cardiovascular:     Rate and Rhythm: Normal rate and regular rhythm.     Pulses: Normal pulses.     Heart sounds: Normal heart sounds. No murmur heard. Pulmonary:     Effort: Pulmonary effort is normal. No respiratory distress.     Breath sounds: Normal breath sounds.  Abdominal:     General: There is no distension.     Palpations: Abdomen is soft.     Tenderness: There is no abdominal tenderness.  Musculoskeletal:     Cervical back: Normal range of motion and neck supple.     Right lower leg: No edema.      Left lower leg: No edema.  Lymphadenopathy:     Cervical: No cervical adenopathy.  Skin:    General: Skin is warm and dry.  Neurological:     Mental Status: She is alert and oriented to person, place, and time.  Psychiatric:        Behavior: Behavior normal.          Assessment & Plan:  Urinary frequency- suspect this is b/c she is getting older and trying to hold bladder longer than she should.  Encouraged her to void regularly as UA is w/o evidence of infxn.  Pt expressed understanding and is in agreement w/ plan.

## 2021-01-13 NOTE — Patient Instructions (Signed)
Follow up in 6 weeks to recheck mood We'll notify you of your lab results and make any changes if needed Continue to work on healthy diet and regular exercise- you can do it! Make sure you are eating regularly and adequately to provide energy through the day Increase the Wellbutrin to 300mg  daily- 2 of what you have at home and 1 of the new prescription Call with any questions or concerns Hang in there!  You've got this!!

## 2021-01-16 NOTE — Assessment & Plan Note (Signed)
Deteriorated.  Pt is having a hard time w/ dad's nursing home placement and the fact that mom is also deteriorating.  Will keep Venlafaxine at 150mg  daily but will increase Wellbutrin to 300mg  daily.  Pt expressed understanding and is in agreement w/ plan.

## 2021-01-16 NOTE — Assessment & Plan Note (Signed)
Chronic problem.  On Lipitor w/o difficulty.  Check labs.  Adjust meds prn

## 2021-01-16 NOTE — Assessment & Plan Note (Signed)
Improving.  Pt is down 13 lbs since last visit but admits she not doing this in a healthy way.  Not eating regular meals and is only snacking throughout the day.  No regular exercise.  Encouraged regular eating and the addition of exercise to improve both physical and mental wellbeing.  Will follow.

## 2021-02-24 ENCOUNTER — Ambulatory Visit (INDEPENDENT_AMBULATORY_CARE_PROVIDER_SITE_OTHER): Payer: Managed Care, Other (non HMO) | Admitting: Family Medicine

## 2021-02-24 ENCOUNTER — Encounter: Payer: Self-pay | Admitting: Family Medicine

## 2021-02-24 VITALS — BP 128/74 | HR 72 | Temp 97.8°F | Resp 17 | Ht 68.0 in | Wt 217.8 lb

## 2021-02-24 DIAGNOSIS — E669 Obesity, unspecified: Secondary | ICD-10-CM

## 2021-02-24 DIAGNOSIS — F331 Major depressive disorder, recurrent, moderate: Secondary | ICD-10-CM | POA: Diagnosis not present

## 2021-02-24 DIAGNOSIS — R748 Abnormal levels of other serum enzymes: Secondary | ICD-10-CM | POA: Diagnosis not present

## 2021-02-24 LAB — HEPATIC FUNCTION PANEL
ALT: 22 U/L (ref 0–35)
AST: 17 U/L (ref 0–37)
Albumin: 4.2 g/dL (ref 3.5–5.2)
Alkaline Phosphatase: 136 U/L — ABNORMAL HIGH (ref 39–117)
Bilirubin, Direct: 0 mg/dL (ref 0.0–0.3)
Total Bilirubin: 0.4 mg/dL (ref 0.2–1.2)
Total Protein: 6.6 g/dL (ref 6.0–8.3)

## 2021-02-24 LAB — GAMMA GT: GGT: 15 U/L (ref 7–51)

## 2021-02-24 NOTE — Assessment & Plan Note (Signed)
Deteriorated.  Pt has gained 9 lbs since last visit.  She admits to snacking rather than eating meals due to high stress levels.  She does not have time for regular exercise at this point b/c her dad has moved in w/ her and requires full time care.  She is trying to get some help so she has more time to take care of herself.  Will continue to follow.

## 2021-02-24 NOTE — Patient Instructions (Addendum)
Schedule your complete physical in April We'll notify you of your lab results and make any changes if needed No med changes at this time We'll call you to schedule your therapy appt Make sure you are taking time for yourself!  You deserve it!  And need it! Call and schedule your mammogram Continue to work on healthy diet and regular exercise Call with any questions or concerns Hang in there! Happy Holidays!!

## 2021-02-24 NOTE — Assessment & Plan Note (Signed)
Ongoing issue.  Pt is in a very difficult position- dad has moved in w/ her and needs full time care and mom is refusing to help or refusing to pay for assistance.  She is aware that there's probably not a medicine out there that can improve the current situation.  She feels that the Wellbutrin and the Effexor are adequate for now but does feel that she would benefit from a therapist.  Referral placed.  Will continue to follow.

## 2021-02-24 NOTE — Progress Notes (Signed)
   Subjective:    Patient ID: Angela Evans, female    DOB: 1971/03/02, 50 y.o.   MRN: [id number]  HPI Depression- at last visit we increased Wellbutrin to 354m daily.  Effexor remains at 1528mdaily.  Now sleeping thru the night but having trouble falling asleep.  Dad is now home w/ her and he needs higher level of care.  Continues to feel guilty about ability to care for dad.  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  She has been distracted and she fears that this will make work more difficult.  She is interested in a counseling referral.  Obesity- pt has gained 9 lbs since last visit.  She continues to snack rather than eat meals due to the stress of caring for dad.  No regular exercise.  Elevated Alk Phos- pt needs GGT done   Review of Systems For ROS see HPI   This visit occurred during the SARS-CoV-2 public health emergency.  Safety protocols were in place, including screening questions prior to the visit, additional usage of staff PPE, and extensive cleaning of exam room while observing appropriate contact time as indicated for disinfecting solutions.      Objective:   Physical Exam Vitals reviewed.  Constitutional:      General: She is not in acute distress.    Appearance: Normal appearance. She is not ill-appearing.  HENT:     Head: Normocephalic and atraumatic.  Eyes:     Extraocular Movements: Extraocular movements intact.     Conjunctiva/sclera: Conjunctivae normal.     Pupils: Pupils are equal, round, and reactive to light.  Skin:    General: Skin is warm and dry.  Neurological:     General: No focal deficit present.     Mental Status: She is alert and oriented to person, place, and time.  Psychiatric:        Mood and Affect: Mood normal.        Behavior: Behavior normal.        Thought Content: Thought content normal.          Assessment & Plan:   Elevated Alk Phos- new.  Asymptomatic.  Repeat LFTs and get GGT to  assess.

## 2021-04-20 ENCOUNTER — Other Ambulatory Visit: Payer: Self-pay | Admitting: Family Medicine

## 2021-04-22 ENCOUNTER — Encounter: Payer: Self-pay | Admitting: Family Medicine

## 2021-04-22 MED ORDER — VENLAFAXINE HCL ER 150 MG PO CP24: ORAL_CAPSULE | ORAL | 0 refills | Status: DC

## 2021-07-19 ENCOUNTER — Other Ambulatory Visit: Payer: Self-pay

## 2021-07-19 MED ORDER — VENLAFAXINE HCL ER 150 MG PO CP24: ORAL_CAPSULE | ORAL | 0 refills | Status: DC

## 2021-08-21 ENCOUNTER — Encounter: Payer: Self-pay | Admitting: Family Medicine

## 2021-08-22 ENCOUNTER — Other Ambulatory Visit: Payer: Self-pay

## 2021-08-22 NOTE — Telephone Encounter (Signed)
Patient is requesting a refill of the following medications: Requested Prescriptions   Pending Prescriptions Disp Refills   venlafaxine XR (EFFEXOR-XR) 150 MG 24 hr capsule 30 capsule 0    Sig: TAKE 1 CAPSULE DAILY WITH BREAKFAST    Date of patient request: 08/21/2021 Last office visit: 02/24/2021 Date of last refill: 07/19/2021 Last refill amount: 30 capsules Follow up time period per chart: n/a

## 2021-08-22 NOTE — Telephone Encounter (Signed)
Medication refill request has been sent to the patient PCP

## 2021-09-01 ENCOUNTER — Encounter: Payer: Self-pay | Admitting: Family Medicine

## 2021-09-01 ENCOUNTER — Ambulatory Visit (INDEPENDENT_AMBULATORY_CARE_PROVIDER_SITE_OTHER): Payer: Managed Care, Other (non HMO) | Admitting: Family Medicine

## 2021-09-01 VITALS — BP 130/80 | HR 71 | Temp 98.0°F | Resp 16 | Ht 68.0 in | Wt 214.0 lb

## 2021-09-01 DIAGNOSIS — Z0000 Encounter for general adult medical examination without abnormal findings: Secondary | ICD-10-CM

## 2021-09-01 DIAGNOSIS — E669 Obesity, unspecified: Secondary | ICD-10-CM | POA: Diagnosis not present

## 2021-09-01 DIAGNOSIS — E559 Vitamin D deficiency, unspecified: Secondary | ICD-10-CM | POA: Diagnosis not present

## 2021-09-01 NOTE — Assessment & Plan Note (Signed)
Pt's PE WNL w/ exception of obesity.  UTD on pap, colonoscopy, Tdap.  Due for mammo.  Pt to schedule.  Check labs.  Anticipatory guidance provided.

## 2021-09-01 NOTE — Assessment & Plan Note (Signed)
Pt is now working on intermittent fasting and has joined [employer].  Applauded her efforts.  Check labs to risk stratify.  Will follow.

## 2021-09-01 NOTE — Progress Notes (Signed)
   Subjective:    Patient ID: Angela Evans, female    DOB: [birth year], 51 y.o.   MRN: [id number]  HPI CPE- UTD on pap, colonoscopy.  Due for mammo.  UTD on Tdap  Patient Care Team    Relationship Specialty Notifications Start End  Midge Minium, MD PCP - General   03/02/10   Linda Hedges, DO Consulting Physician Obstetrics and Gynecology  04/23/15      Health Maintenance  Topic Date Due   MAMMOGRAM  12/23/2020   Hepatitis C Screening  01/13/2022 (Originally 05/03/1990)   INFLUENZA VACCINE  10/18/2021   PAP SMEAR-Modifier  12/24/2022   COLONOSCOPY (Pts 45-57yr Insurance coverage will need to be confirmed)  07/28/2023   TETANUS/TDAP  06/24/2025   HPV VACCINES  Aged Out   COVID-19 Vaccine  Discontinued   HIV Screening  Discontinued      Review of Systems Patient reports no vision/ hearing changes, adenopathy,fever, weight change,  persistant/recurrent hoarseness , swallowing issues, chest pain, palpitations, edema, persistant/recurrent cough, hemoptysis, dyspnea (rest/exertional/paroxysmal nocturnal), gastrointestinal bleeding (melena, rectal bleeding), abdominal pain, significant heartburn, bowel changes, GU symptoms (dysuria, hematuria, incontinence), Gyn symptoms (abnormal  bleeding, pain),  syncope, focal weakness, memory loss, numbness & tingling, skin/hair/nail changes, abnormal bruising or bleeding, anxiety, or depression.     Objective:   Physical Exam General Appearance:    Alert, cooperative, no distress, appears stated age  Head:    Normocephalic, without obvious abnormality, atraumatic  Eyes:    PERRL, conjunctiva/corneas clear, EOM's intact both eyes  Ears:    Normal TM's and external ear canals, both ears  Nose:   Nares normal, septum midline, mucosa normal, no drainage    or sinus tenderness  Throat:   Lips, mucosa, and tongue normal; teeth and gums normal  Neck:   Supple, symmetrical, trachea midline, no adenopathy;    Thyroid: no  enlargement/tenderness/nodules  Back:     Symmetric, no curvature, ROM normal, no CVA tenderness  Lungs:     Clear to auscultation bilaterally, respirations unlabored  Chest Wall:    No tenderness or deformity   Heart:    Regular rate and rhythm, S1 and S2 normal, no murmur, rub   or gallop  Breast Exam:    Deferred to GYN  Abdomen:     Soft, non-tender, bowel sounds active all four quadrants,    no masses, no organomegaly  Genitalia:    Deferred to GYN  Rectal:    Extremities:   Extremities normal, atraumatic, no cyanosis or edema  Pulses:   2+ and symmetric all extremities  Skin:   Skin color, texture, turgor normal, no rashes or lesions  Lymph nodes:   Cervical, supraclavicular, and axillary nodes normal  Neurologic:   CNII-XII intact, normal strength, sensation and reflexes    throughout          Assessment & Plan:

## 2021-09-01 NOTE — Patient Instructions (Addendum)
Follow up in 6 months to recheck weight loss progress and cholesterol We'll notify you of your lab results and make any changes if needed Keep up the good work on healthy diet and regular exercise- you're doing great!! Call and schedule your mammogram Call with any questions or concerns Stay Safe!  Stay Healthy! Have a great summer!!!

## 2021-09-01 NOTE — Assessment & Plan Note (Signed)
Check labs and replete prn. 

## 2021-09-02 LAB — CBC WITH DIFFERENTIAL/PLATELET
Basophils Absolute: 0.1 10*3/uL (ref 0.0–0.1)
Basophils Relative: 1.2 % (ref 0.0–3.0)
Eosinophils Absolute: 0.2 10*3/uL (ref 0.0–0.7)
Eosinophils Relative: 2.4 % (ref 0.0–5.0)
HCT: 44.9 % (ref 36.0–46.0)
Hemoglobin: 15.2 g/dL — ABNORMAL HIGH (ref 12.0–15.0)
Lymphocytes Relative: 39.9 % (ref 12.0–46.0)
Lymphs Abs: 2.9 10*3/uL (ref 0.7–4.0)
MCHC: 33.8 g/dL (ref 30.0–36.0)
MCV: 88.5 fl (ref 78.0–100.0)
Monocytes Absolute: 0.4 10*3/uL (ref 0.1–1.0)
Monocytes Relative: 5.1 % (ref 3.0–12.0)
Neutro Abs: 3.8 10*3/uL (ref 1.4–7.7)
Neutrophils Relative %: 51.4 % (ref 43.0–77.0)
Platelets: 318 10*3/uL (ref 150.0–400.0)
RBC: 5.08 Mil/uL (ref 3.87–5.11)
RDW: 13.1 % (ref 11.5–15.5)
WBC: 7.4 10*3/uL (ref 4.0–10.5)

## 2021-09-02 LAB — HEPATIC FUNCTION PANEL
ALT: 17 U/L (ref 0–35)
AST: 17 U/L (ref 0–37)
Albumin: 4.2 g/dL (ref 3.5–5.2)
Alkaline Phosphatase: 117 U/L (ref 39–117)
Bilirubin, Direct: 0.1 mg/dL (ref 0.0–0.3)
Total Bilirubin: 0.4 mg/dL (ref 0.2–1.2)
Total Protein: 6.9 g/dL (ref 6.0–8.3)

## 2021-09-02 LAB — LIPID PANEL
Cholesterol: 243 mg/dL — ABNORMAL HIGH (ref 0–200)
HDL: 37 mg/dL — ABNORMAL LOW (ref >39.00)
LDL Cholesterol: 177 mg/dL — ABNORMAL HIGH (ref 0–99)
NonHDL: 205.54
Total CHOL/HDL Ratio: 7
Triglycerides: 145 mg/dL (ref 0.0–149.0)
VLDL: 29 mg/dL (ref 0.0–40.0)

## 2021-09-02 LAB — VITAMIN D 25 HYDROXY (VIT D DEFICIENCY, FRACTURES): VITD: 30.12 ng/mL (ref 30.00–100.00)

## 2021-09-02 LAB — BASIC METABOLIC PANEL
BUN: 8 mg/dL (ref 6–23)
CO2: 23 mEq/L (ref 19–32)
Calcium: 9.3 mg/dL (ref 8.4–10.5)
Chloride: 102 mEq/L (ref 96–112)
Creatinine, Ser: 1.09 mg/dL (ref 0.40–1.20)
GFR: 59.73 mL/min — ABNORMAL LOW (ref >60.00)
Glucose, Bld: 77 mg/dL (ref 70–99)
Potassium: 3.9 mEq/L (ref 3.5–5.1)
Sodium: 134 mEq/L — ABNORMAL LOW (ref 135–145)

## 2021-09-02 LAB — TSH: TSH: 1.58 u[IU]/mL (ref 0.35–5.50)

## 2021-09-05 NOTE — Progress Notes (Signed)
Pt seen Results VI my chart

## 2021-12-02 LAB — HM MAMMOGRAPHY

## 2021-12-07 LAB — HM PAP SMEAR
HPV 16/18/45 genotyping: NEGATIVE
HPV, high-risk: POSITIVE

## 2022-03-08 ENCOUNTER — Encounter: Payer: Self-pay | Admitting: Family Medicine

## 2022-03-08 ENCOUNTER — Ambulatory Visit: Payer: Managed Care, Other (non HMO) | Admitting: Family Medicine

## 2022-03-08 VITALS — BP 130/82 | HR 71 | Temp 98.8°F | Resp 17 | Ht 68.0 in | Wt 203.1 lb

## 2022-03-08 DIAGNOSIS — E669 Obesity, unspecified: Secondary | ICD-10-CM | POA: Diagnosis not present

## 2022-03-08 DIAGNOSIS — E785 Hyperlipidemia, unspecified: Secondary | ICD-10-CM

## 2022-03-08 DIAGNOSIS — F331 Major depressive disorder, recurrent, moderate: Secondary | ICD-10-CM | POA: Diagnosis not present

## 2022-03-08 LAB — BASIC METABOLIC PANEL
BUN: 11 mg/dL (ref 6–23)
CO2: 25 mEq/L (ref 19–32)
Calcium: 9.2 mg/dL (ref 8.4–10.5)
Chloride: 102 mEq/L (ref 96–112)
Creatinine, Ser: 1.08 mg/dL (ref 0.40–1.20)
GFR: 60.17 mL/min (ref >60.00)
Glucose, Bld: 98 mg/dL (ref 70–99)
Potassium: 4.2 mEq/L (ref 3.5–5.1)
Sodium: 135 mEq/L (ref 135–145)

## 2022-03-08 LAB — CBC WITH DIFFERENTIAL/PLATELET
Basophils Absolute: 0.1 10*3/uL (ref 0.0–0.1)
Basophils Relative: 0.9 % (ref 0.0–3.0)
Eosinophils Absolute: 0.1 10*3/uL (ref 0.0–0.7)
Eosinophils Relative: 1.5 % (ref 0.0–5.0)
HCT: 45.1 % (ref 36.0–46.0)
Hemoglobin: 15.6 g/dL — ABNORMAL HIGH (ref 12.0–15.0)
Lymphocytes Relative: 24.5 % (ref 12.0–46.0)
Lymphs Abs: 2 10*3/uL (ref 0.7–4.0)
MCHC: 34.5 g/dL (ref 30.0–36.0)
MCV: 88 fl (ref 78.0–100.0)
Monocytes Absolute: 0.4 10*3/uL (ref 0.1–1.0)
Monocytes Relative: 4.9 % (ref 3.0–12.0)
Neutro Abs: 5.5 10*3/uL (ref 1.4–7.7)
Neutrophils Relative %: 68.2 % (ref 43.0–77.0)
Platelets: 338 10*3/uL (ref 150.0–400.0)
RBC: 5.12 Mil/uL — ABNORMAL HIGH (ref 3.87–5.11)
RDW: 13.3 % (ref 11.5–15.5)
WBC: 8 10*3/uL (ref 4.0–10.5)

## 2022-03-08 LAB — HEPATIC FUNCTION PANEL
ALT: 11 U/L (ref 0–35)
AST: 12 U/L (ref 0–37)
Albumin: 4.3 g/dL (ref 3.5–5.2)
Alkaline Phosphatase: 125 U/L — ABNORMAL HIGH (ref 39–117)
Bilirubin, Direct: 0 mg/dL (ref 0.0–0.3)
Total Bilirubin: 0.3 mg/dL (ref 0.2–1.2)
Total Protein: 6.9 g/dL (ref 6.0–8.3)

## 2022-03-08 LAB — LIPID PANEL
Cholesterol: 267 mg/dL — ABNORMAL HIGH (ref 0–200)
HDL: 48.1 mg/dL (ref >39.00)
LDL Cholesterol: 197 mg/dL — ABNORMAL HIGH (ref 0–99)
NonHDL: 219.35
Total CHOL/HDL Ratio: 6
Triglycerides: 111 mg/dL (ref 0.0–149.0)
VLDL: 22.2 mg/dL (ref 0.0–40.0)

## 2022-03-08 LAB — TSH: TSH: 1.47 u[IU]/mL (ref 0.35–5.50)

## 2022-03-08 NOTE — Assessment & Plan Note (Signed)
Ongoing issue for pt.  Currently doing well on Venlafaxine XR '150mg'$  daily.  No med changes at this time

## 2022-03-08 NOTE — Progress Notes (Signed)
   Subjective:    Patient ID: Angela Evans, female    DOB: December 20, 1970, 51 y.o.   MRN: [id number]  HPI Hyperlipidemia- attempting to control w/ diet and exercise.  No CP, SOB, abd pain, N/V.  Obesity- pt is down 11 lbs since last visit.  Pt reports feeling good.  Doing intermittent fasting  Depression- currently on Venlafaxine XR '150mg'$  daily.  Pt reports mood is good.   Review of Systems For ROS see HPI     Objective:   Physical Exam Vitals reviewed.  Constitutional:      General: She is not in acute distress.    Appearance: Normal appearance. She is well-developed. She is not ill-appearing.  HENT:     Head: Normocephalic and atraumatic.  Eyes:     Conjunctiva/sclera: Conjunctivae normal.     Pupils: Pupils are equal, round, and reactive to light.  Neck:     Thyroid: No thyromegaly.  Cardiovascular:     Rate and Rhythm: Normal rate and regular rhythm.     Pulses: Normal pulses.     Heart sounds: Normal heart sounds. No murmur heard. Pulmonary:     Effort: Pulmonary effort is normal. No respiratory distress.     Breath sounds: Normal breath sounds.  Abdominal:     General: There is no distension.     Palpations: Abdomen is soft.     Tenderness: There is no abdominal tenderness.  Musculoskeletal:     Cervical back: Normal range of motion and neck supple.     Right lower leg: No edema.     Left lower leg: No edema.  Lymphadenopathy:     Cervical: No cervical adenopathy.  Skin:    General: Skin is warm and dry.  Neurological:     Mental Status: She is alert and oriented to person, place, and time.  Psychiatric:        Behavior: Behavior normal.           Assessment & Plan:

## 2022-03-08 NOTE — Assessment & Plan Note (Signed)
Attempting to control w/ diet and exercise.  States that if labs are still elevated is willing to take medication.  Will follow.

## 2022-03-08 NOTE — Assessment & Plan Note (Signed)
Pt is down 11 lbs since last visit.  She is doing very well w/ intermittent fasting.  Applauded her efforts.  Will continue to follow.

## 2022-03-08 NOTE — Patient Instructions (Signed)
Schedule your complete physical in 6 months We'll notify you of your lab results and make any changes if needed Keep up the good work!  You look great!!! Call with any questions or concerns Stay Safe!  Stay Healthy! Happy Holidays!!!

## 2022-03-09 ENCOUNTER — Other Ambulatory Visit: Payer: Self-pay

## 2022-03-09 ENCOUNTER — Telehealth: Payer: Self-pay

## 2022-03-09 DIAGNOSIS — E785 Hyperlipidemia, unspecified: Secondary | ICD-10-CM

## 2022-03-09 MED ORDER — ROSUVASTATIN CALCIUM 20 MG PO TABS: 20.0000 mg | ORAL_TABLET | Freq: Every day | ORAL | 3 refills | Status: DC

## 2022-03-09 NOTE — Telephone Encounter (Signed)
Informed pt of lab results and she is scheduled for lab only visit for repeat liver function test and order is in . Crestor 20 mg has been sent in to [facility] as ask by pt

## 2022-03-09 NOTE — Telephone Encounter (Signed)
-----   Message from Midge Minium, MD sent at 03/09/2022  7:54 AM EST ----- Total cholesterol and LDL (bad cholesterol) have both increased.  Your HDL (good cholesterol) has gone up which is great news!  But we have to get that bad cholesterol down.  Based on this, we need to start Crestor '20mg'$  nightly (#30, 3 refills) and repeat liver functions at a lab only visit in 6-8 weeks (dx hyperlipidemia)  Remainder of labs are stable and look good!

## 2022-03-16 ENCOUNTER — Encounter: Payer: Self-pay | Admitting: Family Medicine

## 2022-03-16 LAB — HM MAMMOGRAPHY: HM Mammogram: NORMAL (ref 0–4)

## 2022-03-16 NOTE — Telephone Encounter (Signed)
Pt recently started crestor, notes hives advised stop and take benadryl, pt would like an alternative

## 2022-03-17 NOTE — Telephone Encounter (Signed)
Agree with stopping Crestor for now and with hives, updated messages, agree with urgent care evaluation.

## 2022-04-12 ENCOUNTER — Other Ambulatory Visit: Payer: Managed Care, Other (non HMO)

## 2022-05-22 ENCOUNTER — Other Ambulatory Visit: Payer: Self-pay | Admitting: Family Medicine

## 2022-05-22 ENCOUNTER — Encounter: Payer: Self-pay | Admitting: Family Medicine

## 2022-06-01 DIAGNOSIS — N939 Abnormal uterine and vaginal bleeding, unspecified: Secondary | ICD-10-CM | POA: Diagnosis not present

## 2022-06-01 DIAGNOSIS — N950 Postmenopausal bleeding: Secondary | ICD-10-CM | POA: Diagnosis not present

## 2022-06-26 ENCOUNTER — Other Ambulatory Visit: Payer: Self-pay | Admitting: Family Medicine

## 2022-07-03 ENCOUNTER — Ambulatory Visit (INDEPENDENT_AMBULATORY_CARE_PROVIDER_SITE_OTHER): Payer: BLUE CROSS/BLUE SHIELD | Admitting: Family Medicine

## 2022-07-03 ENCOUNTER — Encounter: Payer: Self-pay | Admitting: Family Medicine

## 2022-07-03 VITALS — BP 114/70 | HR 62 | Temp 97.7°F | Resp 17 | Ht 68.0 in | Wt 215.4 lb

## 2022-07-03 DIAGNOSIS — N950 Postmenopausal bleeding: Secondary | ICD-10-CM | POA: Diagnosis not present

## 2022-07-03 DIAGNOSIS — E559 Vitamin D deficiency, unspecified: Secondary | ICD-10-CM

## 2022-07-03 DIAGNOSIS — Z0000 Encounter for general adult medical examination without abnormal findings: Secondary | ICD-10-CM

## 2022-07-03 DIAGNOSIS — E669 Obesity, unspecified: Secondary | ICD-10-CM | POA: Diagnosis not present

## 2022-07-03 DIAGNOSIS — N941 Unspecified dyspareunia: Secondary | ICD-10-CM | POA: Diagnosis not present

## 2022-07-03 LAB — HEPATIC FUNCTION PANEL
ALT: 14 U/L (ref 0–35)
AST: 16 U/L (ref 0–37)
Albumin: 4.4 g/dL (ref 3.5–5.2)
Alkaline Phosphatase: 130 U/L — ABNORMAL HIGH (ref 39–117)
Bilirubin, Direct: 0.1 mg/dL (ref 0.0–0.3)
Total Bilirubin: 0.4 mg/dL (ref 0.2–1.2)
Total Protein: 7 g/dL (ref 6.0–8.3)

## 2022-07-03 LAB — LIPID PANEL
Cholesterol: 267 mg/dL — ABNORMAL HIGH (ref 0–200)
HDL: 40.7 mg/dL (ref >39.00)
LDL Cholesterol: 188 mg/dL — ABNORMAL HIGH (ref 0–99)
NonHDL: 225.91
Total CHOL/HDL Ratio: 7
Triglycerides: 189 mg/dL — ABNORMAL HIGH (ref 0.0–149.0)
VLDL: 37.8 mg/dL (ref 0.0–40.0)

## 2022-07-03 LAB — CBC WITH DIFFERENTIAL/PLATELET
Basophils Absolute: 0.1 10*3/uL (ref 0.0–0.1)
Basophils Relative: 1.3 % (ref 0.0–3.0)
Eosinophils Absolute: 0.2 10*3/uL (ref 0.0–0.7)
Eosinophils Relative: 1.9 % (ref 0.0–5.0)
HCT: 46.6 % — ABNORMAL HIGH (ref 36.0–46.0)
Hemoglobin: 15.9 g/dL — ABNORMAL HIGH (ref 12.0–15.0)
Lymphocytes Relative: 28.9 % (ref 12.0–46.0)
Lymphs Abs: 2.7 10*3/uL (ref 0.7–4.0)
MCHC: 34.1 g/dL (ref 30.0–36.0)
MCV: 88.8 fl (ref 78.0–100.0)
Monocytes Absolute: 0.5 10*3/uL (ref 0.1–1.0)
Monocytes Relative: 5.4 % (ref 3.0–12.0)
Neutro Abs: 5.9 10*3/uL (ref 1.4–7.7)
Neutrophils Relative %: 62.5 % (ref 43.0–77.0)
Platelets: 325 10*3/uL (ref 150.0–400.0)
RBC: 5.25 Mil/uL — ABNORMAL HIGH (ref 3.87–5.11)
RDW: 13.3 % (ref 11.5–15.5)
WBC: 9.5 10*3/uL (ref 4.0–10.5)

## 2022-07-03 LAB — BASIC METABOLIC PANEL
BUN: 10 mg/dL (ref 6–23)
CO2: 24 mEq/L (ref 19–32)
Calcium: 9.2 mg/dL (ref 8.4–10.5)
Chloride: 102 mEq/L (ref 96–112)
Creatinine, Ser: 1.04 mg/dL (ref 0.40–1.20)
GFR: 62.82 mL/min (ref >60.00)
Glucose, Bld: 95 mg/dL (ref 70–99)
Potassium: 3.7 mEq/L (ref 3.5–5.1)
Sodium: 135 mEq/L (ref 135–145)

## 2022-07-03 LAB — VITAMIN D 25 HYDROXY (VIT D DEFICIENCY, FRACTURES): VITD: 19.05 ng/mL — ABNORMAL LOW (ref 30.00–100.00)

## 2022-07-03 LAB — TSH: TSH: 2.28 u[IU]/mL (ref 0.35–5.50)

## 2022-07-03 NOTE — Assessment & Plan Note (Signed)
Ongoing issue for pt.  She has gained 12 lbs since last visit.  She is under a lot of stress w/ parents and their health issues.  Encouraged healthy diet and regular exercise.  Check labs to risk stratify.  Will follow.

## 2022-07-03 NOTE — Assessment & Plan Note (Signed)
Pt's PE WNL w/ exception of BMI.  UTD on pap, mammo, colonoscopy, Tdap.  Check labs.  Anticipatory guidance provided.  

## 2022-07-03 NOTE — Assessment & Plan Note (Signed)
Check labs and replete prn. 

## 2022-07-03 NOTE — Patient Instructions (Signed)
Follow up in 6 months to recheck cholesterol We'll notify you of your lab results and make any changes if needed Continue to work on healthy diet and regular exercise- you can do it!!! Try and quit smoking!! Call your insurance and ask about weight loss coverage- Wegovy or Zepbound in particular Call with any questions or concerns Stay Safe!  Stay Healthy!!! Happy Spring!!

## 2022-07-03 NOTE — Progress Notes (Signed)
   Subjective:    Patient ID: Angela Evans, female    DOB: [birth year], 52 y.o.   MRN: [id number]  HPI CPE- UTD on pap, mammo, colonoscopy, Tdap.  Patient Care Team    Relationship Specialty Notifications Start End  Sheliah Hatch, MD PCP - General   03/02/10   Mitchel Honour, DO Consulting Physician Obstetrics and Gynecology  04/23/15     Health Maintenance  Topic Date Due   Zoster Vaccines- Shingrix (1 of 2) 10/02/2022 (Originally 05/03/2022)   INFLUENZA VACCINE  10/19/2022   MAMMOGRAM  12/30/2022   COLONOSCOPY (Pts 45-78yrs Insurance coverage will need to be confirmed)  07/28/2023   PAP SMEAR-Modifier  12/07/2024   DTaP/Tdap/Td (2 - Td or Tdap) 06/24/2025   HPV VACCINES  Aged Out   COVID-19 Vaccine  Discontinued   Hepatitis C Screening  Discontinued   HIV Screening  Discontinued      Review of Systems Patient reports no vision/ hearing changes, adenopathy,fever, persistant/recurrent hoarseness , swallowing issues, chest pain, palpitations, edema, persistant/recurrent cough, hemoptysis, dyspnea (rest/exertional/paroxysmal nocturnal), gastrointestinal bleeding (melena, rectal bleeding), abdominal pain, significant heartburn, bowel changes, GU symptoms (dysuria, hematuria, incontinence), Gyn symptoms (abnormal  bleeding, pain),  syncope, focal weakness, memory loss, numbness & tingling, skin/hair/nail changes, abnormal bruising or bleeding, anxiety, or depression.   + 12 lb weight gain    Objective:   Physical Exam General Appearance:    Alert, cooperative, no distress, appears stated age  Head:    Normocephalic, without obvious abnormality, atraumatic  Eyes:    PERRL, conjunctiva/corneas clear, EOM's intact both eyes  Ears:    Normal TM's and external ear canals, both ears  Nose:   Nares normal, septum midline, mucosa normal, no drainage    or sinus tenderness  Throat:   Lips, mucosa, and tongue normal; teeth and gums normal  Neck:   Supple, symmetrical, trachea  midline, no adenopathy;    Thyroid: no enlargement/tenderness/nodules  Back:     Symmetric, no curvature, ROM normal, no CVA tenderness  Lungs:     Clear to auscultation bilaterally, respirations unlabored  Chest Wall:    No tenderness or deformity   Heart:    Regular rate and rhythm, S1 and S2 normal, no murmur, rub   or gallop  Breast Exam:    Deferred to GYN  Abdomen:     Soft, non-tender, bowel sounds active all four quadrants,    no masses, no organomegaly  Genitalia:    Deferred to GYN  Rectal:    Extremities:   Extremities normal, atraumatic, no cyanosis or edema  Pulses:   2+ and symmetric all extremities  Skin:   Skin color, texture, turgor normal, no rashes or lesions  Lymph nodes:   Cervical, supraclavicular, and axillary nodes normal  Neurologic:   CNII-XII intact, normal strength, sensation and reflexes    throughout          Assessment & Plan:

## 2022-07-04 ENCOUNTER — Other Ambulatory Visit: Payer: Self-pay

## 2022-07-04 ENCOUNTER — Telehealth: Payer: Self-pay

## 2022-07-04 DIAGNOSIS — E559 Vitamin D deficiency, unspecified: Secondary | ICD-10-CM

## 2022-07-04 DIAGNOSIS — E785 Hyperlipidemia, unspecified: Secondary | ICD-10-CM

## 2022-07-04 MED ORDER — VITAMIN D (ERGOCALCIFEROL) 1.25 MG (50000 UNIT) PO CAPS
50000.0000 [IU] | ORAL_CAPSULE | ORAL | 12 refills | Status: DC
Start: 2022-07-04 — End: 2023-08-06

## 2022-07-04 MED ORDER — EZETIMIBE 10 MG PO TABS
10.0000 mg | ORAL_TABLET | Freq: Every day | ORAL | Status: DC
Start: 2022-07-04 — End: 2023-01-01

## 2022-07-04 NOTE — Telephone Encounter (Signed)
Informed pt of lab results and she states she had an allergic reaction the Crestor broke out in hives . I have added this to her allergy list . Wants to know what else can she take ? Vit d 50,000 units has been sent in

## 2022-07-04 NOTE — Telephone Encounter (Signed)
-----   Message from Sheliah Hatch, MD sent at 07/04/2022  7:21 AM EDT ----- Vit D is low.  Based on this, we need to start 50,000 units weekly x12 weeks in addition to daily OTC supplement of at least 2000 units.   Your total cholesterol and LDL (bad cholesterol) are again quite elevated.  Based on this, I would like to restart your Crestor  nightly (#30, 3 refills) while working on healthy diet and regular exercise.  We will repeat your liver functions at a lab only visit in 6 weeks (dx hyperlipidemia) to make sure you are metabolizing the medication appropriately.  Remainder of labs are stable and look good

## 2022-07-04 NOTE — Telephone Encounter (Signed)
Patient was returning a call from [place]. Patient stated that [phone] is the best contact number for her.

## 2022-07-04 NOTE — Telephone Encounter (Signed)
I spoke to the pt and advised her that we can try her on the Zetia 10 mg . Pt is agreeable to med . Sent in Rx

## 2022-07-04 NOTE — Telephone Encounter (Signed)
We can do Zetia  daily, #90, 1 refill.  No need to recheck LFTs at lab visit.

## 2022-07-04 NOTE — Telephone Encounter (Signed)
Called to inform no answer LM to call back and discuss

## 2022-08-04 ENCOUNTER — Other Ambulatory Visit: Payer: Self-pay | Admitting: Family Medicine

## 2022-09-04 ENCOUNTER — Other Ambulatory Visit: Payer: Self-pay | Admitting: Family Medicine

## 2022-09-04 NOTE — Telephone Encounter (Signed)
Pt was given another 90 day supply, she is due for appt in October.

## 2022-12-01 ENCOUNTER — Other Ambulatory Visit: Payer: Self-pay | Admitting: Family Medicine

## 2022-12-31 ENCOUNTER — Other Ambulatory Visit: Payer: Self-pay | Admitting: Family Medicine

## 2022-12-31 DIAGNOSIS — E785 Hyperlipidemia, unspecified: Secondary | ICD-10-CM

## 2023-01-16 DIAGNOSIS — Z6832 Body mass index (BMI) 32.0-32.9, adult: Secondary | ICD-10-CM | POA: Diagnosis not present

## 2023-01-16 DIAGNOSIS — Z01419 Encounter for gynecological examination (general) (routine) without abnormal findings: Secondary | ICD-10-CM | POA: Diagnosis not present

## 2023-01-16 DIAGNOSIS — Z1231 Encounter for screening mammogram for malignant neoplasm of breast: Secondary | ICD-10-CM | POA: Diagnosis not present

## 2023-01-16 DIAGNOSIS — Z124 Encounter for screening for malignant neoplasm of cervix: Secondary | ICD-10-CM | POA: Diagnosis not present

## 2023-01-16 DIAGNOSIS — Z1151 Encounter for screening for human papillomavirus (HPV): Secondary | ICD-10-CM | POA: Diagnosis not present

## 2023-01-16 LAB — HM MAMMOGRAPHY

## 2023-02-22 DIAGNOSIS — N950 Postmenopausal bleeding: Secondary | ICD-10-CM | POA: Diagnosis not present

## 2023-03-01 ENCOUNTER — Other Ambulatory Visit: Payer: Self-pay | Admitting: Family Medicine

## 2023-05-28 ENCOUNTER — Other Ambulatory Visit: Payer: Self-pay | Admitting: Family Medicine

## 2023-05-29 ENCOUNTER — Ambulatory Visit: Admitting: Family Medicine

## 2023-05-29 ENCOUNTER — Encounter: Payer: Self-pay | Admitting: Family Medicine

## 2023-05-29 VITALS — BP 118/74 | HR 66 | Temp 98.1°F | Ht 68.0 in | Wt 216.1 lb

## 2023-05-29 DIAGNOSIS — E785 Hyperlipidemia, unspecified: Secondary | ICD-10-CM | POA: Diagnosis not present

## 2023-05-29 DIAGNOSIS — F331 Major depressive disorder, recurrent, moderate: Secondary | ICD-10-CM | POA: Diagnosis not present

## 2023-05-29 DIAGNOSIS — M722 Plantar fascial fibromatosis: Secondary | ICD-10-CM

## 2023-05-29 DIAGNOSIS — E669 Obesity, unspecified: Secondary | ICD-10-CM | POA: Diagnosis not present

## 2023-05-29 MED ORDER — VENLAFAXINE HCL ER 150 MG PO CP24: ORAL_CAPSULE | ORAL | 1 refills | Status: DC

## 2023-05-29 MED ORDER — EZETIMIBE 10 MG PO TABS: 10.0000 mg | ORAL_TABLET | Freq: Every day | ORAL | 1 refills | Status: DC

## 2023-05-29 NOTE — Assessment & Plan Note (Signed)
 Ongoing issue for pt.  Now seeing a bariatric clinic and is on Semaglutide.  Encouraged addition of regular exercise to healthy food choices.  Will follow.

## 2023-05-29 NOTE — Assessment & Plan Note (Signed)
 Chronic problem.  Currently on Zetia 10mg  daily b/c Crestor caused hives.  Reviewed recent labs that does show some improvement in total cholesterol, LDL, and HDL.  Encouraged healthy diet and regular exercise.  Will continue to follow.

## 2023-05-29 NOTE — Patient Instructions (Signed)
 Schedule your complete physical in 6 months No need to repeat your labs today- they looked good! Continue to work on healthy diet and regular exercise- you can do it! We'll call you to schedule your Sports Med appt If you change your mind about mood medicine- let me know! Call with any questions or concerns Hang in there!!

## 2023-05-29 NOTE — Telephone Encounter (Signed)
 PRINTED

## 2023-05-29 NOTE — Assessment & Plan Note (Signed)
 Deteriorated.  Pt reports work stress is very high and she may need to find a new job.  She is not interested in changing or adding medication at this time.  Told her we can revisit this at any point- she just needs to let me know.  Pt expressed understanding and is in agreement w/ plan.

## 2023-05-29 NOTE — Progress Notes (Signed)
   Subjective:    Patient ID: Angela Evans, female    DOB: Jul 21, 1970, 53 y.o.   MRN: [id number]  HPI Hyperlipidemia- chronic problem.  Is on Zetia 10mg  daily.  Not currently on Crestor 20mg - broke out in hives.  Labs done in January show total cholesterol 248, LDL 172, HDL 49.  Depression- pt's PHQ9=19 today.  Pt is overwhelmed at work.  Reports things are ok at home.  Pt is not interested in medication change at this time.  Currently on Effexor  Obesity- ongoing issue.  BMI currently 32.86  She is seeing Bariatric Clinic.  Currently on Semaglutide.  No regular exercise  Plantar fasciitis- bilateral.  R has been ongoing for months, L is more recent.   Review of Systems For ROS see HPI     Objective:   Physical Exam Vitals reviewed.  Constitutional:      General: She is not in acute distress.    Appearance: Normal appearance. She is well-developed. She is obese. She is not ill-appearing.  HENT:     Head: Normocephalic and atraumatic.  Eyes:     Conjunctiva/sclera: Conjunctivae normal.     Pupils: Pupils are equal, round, and reactive to light.  Neck:     Thyroid: No thyromegaly.  Cardiovascular:     Rate and Rhythm: Normal rate and regular rhythm.     Heart sounds: Normal heart sounds. No murmur heard. Pulmonary:     Effort: Pulmonary effort is normal. No respiratory distress.     Breath sounds: Normal breath sounds.  Abdominal:     General: There is no distension.     Palpations: Abdomen is soft.     Tenderness: There is no abdominal tenderness.  Musculoskeletal:     Cervical back: Normal range of motion and neck supple.  Lymphadenopathy:     Cervical: No cervical adenopathy.  Skin:    General: Skin is warm and dry.  Neurological:     General: No focal deficit present.     Mental Status: She is alert and oriented to person, place, and time.  Psychiatric:        Mood and Affect: Mood normal.        Behavior: Behavior normal.        Thought Content: Thought  content normal.           Assessment & Plan:  Bilateral plantar fasciitis- new to provider, ongoing for pt.  Refer to Sports Med for complete evaluation and tx.  Pt expressed understanding and is in agreement w/ plan.

## 2023-05-31 ENCOUNTER — Ambulatory Visit: Admitting: Family Medicine

## 2023-05-31 VITALS — BP 166/98 | HR 71 | Ht 68.0 in | Wt 220.0 lb

## 2023-05-31 DIAGNOSIS — M722 Plantar fascial fibromatosis: Secondary | ICD-10-CM | POA: Diagnosis not present

## 2023-05-31 NOTE — Progress Notes (Unsigned)
   Rubin Payor, PhD, LAT, ATC acting as a scribe for Clementeen Graham, MD.  AVEENA BARI is a 53 y.o. female who presents to [facility] today for bilat foot pain, R for months, L more recent. No injury. Pt locates pain to the plantar aspect of both heels. Pain is worse in the mornings and after prolonged sitting then transitioning to stand. Pain is limiting her ability to exercise.   Treatments tried: none  Pertinent review of systems: No fevers or chills  Relevant historical information: Hyperlipidemia obesity depression   Exam:  BP (!) 166/98   Pulse 71   Ht 5\' 8"  (1.727 m)   Wt 220 lb (99.8 kg)   SpO2 98%   BMI 33.45 kg/m  General: Well Developed, well nourished, and in no acute distress.   MSK: Feet bilaterally normal-appearing tender palpation plantar calcaneus.  Normal foot and ankle motion.      Assessment and Plan: 53 y.o. female with bilateral plantar fasciitis.  Plan to treat with eccentric exercise heel cushions ice massage night splints and arch straps.  Recheck in 1 month if not improved consider injection or possibly shockwave.   PDMP not reviewed this encounter. No orders of the defined types were placed in this encounter.  No orders of the defined types were placed in this encounter.    Discussed warning signs or symptoms. Please see discharge instructions. Patient expresses understanding.   The above documentation has been reviewed and is accurate and complete Clementeen Graham, M.D.

## 2023-05-31 NOTE — Patient Instructions (Addendum)
 Thank you for coming in today.   Gel heel cups  Plantar fascia night splints  Ice massage  Arch strap  Please work on the home exercises the athletic trainer went over with you: View at [email or web address] using code: [email or web address]  Check back in 1 month

## 2023-06-01 DIAGNOSIS — M722 Plantar fascial fibromatosis: Secondary | ICD-10-CM | POA: Insufficient documentation

## 2023-06-29 ENCOUNTER — Ambulatory Visit: Admitting: Family Medicine

## 2023-08-02 ENCOUNTER — Other Ambulatory Visit: Payer: Self-pay | Admitting: Family Medicine

## 2023-08-02 DIAGNOSIS — E559 Vitamin D deficiency, unspecified: Secondary | ICD-10-CM

## 2023-08-06 ENCOUNTER — Encounter: Payer: Self-pay | Admitting: Family Medicine

## 2023-08-06 DIAGNOSIS — E559 Vitamin D deficiency, unspecified: Secondary | ICD-10-CM

## 2023-08-06 MED ORDER — VITAMIN D (ERGOCALCIFEROL) 1.25 MG (50000 UNIT) PO CAPS: 50000.0000 [IU] | ORAL_CAPSULE | ORAL | 12 refills | Status: AC

## 2023-08-06 NOTE — Addendum Note (Signed)
 Addended by: Izzy Courville E on: 08/06/2023 03:51 PM   Modules accepted: Orders

## 2023-11-26 ENCOUNTER — Other Ambulatory Visit: Payer: Self-pay | Admitting: Family Medicine

## 2023-11-27 DIAGNOSIS — N951 Menopausal and female climacteric states: Secondary | ICD-10-CM | POA: Insufficient documentation

## 2023-11-27 DIAGNOSIS — F5101 Primary insomnia: Secondary | ICD-10-CM | POA: Insufficient documentation

## 2023-11-27 DIAGNOSIS — F419 Anxiety disorder, unspecified: Secondary | ICD-10-CM | POA: Insufficient documentation

## 2023-11-27 DIAGNOSIS — G9332 Myalgic encephalomyelitis/chronic fatigue syndrome: Secondary | ICD-10-CM | POA: Insufficient documentation

## 2023-11-27 DIAGNOSIS — R232 Flushing: Secondary | ICD-10-CM | POA: Insufficient documentation

## 2023-11-27 DIAGNOSIS — R7301 Impaired fasting glucose: Secondary | ICD-10-CM | POA: Insufficient documentation

## 2023-11-27 DIAGNOSIS — R6882 Decreased libido: Secondary | ICD-10-CM | POA: Insufficient documentation

## 2023-11-27 DIAGNOSIS — F3342 Major depressive disorder, recurrent, in full remission: Secondary | ICD-10-CM | POA: Insufficient documentation

## 2023-11-28 ENCOUNTER — Ambulatory Visit (INDEPENDENT_AMBULATORY_CARE_PROVIDER_SITE_OTHER): Admitting: Family Medicine

## 2023-11-28 ENCOUNTER — Ambulatory Visit: Payer: Self-pay | Admitting: Family Medicine

## 2023-11-28 ENCOUNTER — Encounter: Payer: Self-pay | Admitting: Family Medicine

## 2023-11-28 VITALS — BP 122/78 | HR 70 | Temp 98.0°F | Ht 68.0 in | Wt 230.1 lb

## 2023-11-28 DIAGNOSIS — E559 Vitamin D deficiency, unspecified: Secondary | ICD-10-CM

## 2023-11-28 DIAGNOSIS — Z0000 Encounter for general adult medical examination without abnormal findings: Secondary | ICD-10-CM

## 2023-11-28 DIAGNOSIS — Z1159 Encounter for screening for other viral diseases: Secondary | ICD-10-CM

## 2023-11-28 DIAGNOSIS — Z114 Encounter for screening for human immunodeficiency virus [HIV]: Secondary | ICD-10-CM

## 2023-11-28 DIAGNOSIS — E785 Hyperlipidemia, unspecified: Secondary | ICD-10-CM | POA: Diagnosis not present

## 2023-11-28 DIAGNOSIS — Z1211 Encounter for screening for malignant neoplasm of colon: Secondary | ICD-10-CM

## 2023-11-28 LAB — CBC WITH DIFFERENTIAL/PLATELET
Basophils Absolute: 0.1 K/uL (ref 0.0–0.1)
Basophils Relative: 1.3 % (ref 0.0–3.0)
Eosinophils Absolute: 0.3 K/uL (ref 0.0–0.7)
Eosinophils Relative: 2.9 % (ref 0.0–5.0)
HCT: 46.8 % — ABNORMAL HIGH (ref 36.0–46.0)
Hemoglobin: 16 g/dL — ABNORMAL HIGH (ref 12.0–15.0)
Lymphocytes Relative: 25.6 % (ref 12.0–46.0)
Lymphs Abs: 2.3 K/uL (ref 0.7–4.0)
MCHC: 34.2 g/dL (ref 30.0–36.0)
MCV: 88.2 fl (ref 78.0–100.0)
Monocytes Absolute: 0.5 K/uL (ref 0.1–1.0)
Monocytes Relative: 5.3 % (ref 3.0–12.0)
Neutro Abs: 5.8 K/uL (ref 1.4–7.7)
Neutrophils Relative %: 64.9 % (ref 43.0–77.0)
Platelets: 336 K/uL (ref 150.0–400.0)
RBC: 5.31 Mil/uL — ABNORMAL HIGH (ref 3.87–5.11)
RDW: 12.9 % (ref 11.5–15.5)
WBC: 8.9 K/uL (ref 4.0–10.5)

## 2023-11-28 LAB — VITAMIN D 25 HYDROXY (VIT D DEFICIENCY, FRACTURES): VITD: 45.14 ng/mL (ref 30.00–100.00)

## 2023-11-28 LAB — HEPATIC FUNCTION PANEL
ALT: 19 U/L (ref 0–35)
AST: 17 U/L (ref 0–37)
Albumin: 4.2 g/dL (ref 3.5–5.2)
Alkaline Phosphatase: 119 U/L — ABNORMAL HIGH (ref 39–117)
Bilirubin, Direct: 0 mg/dL (ref 0.0–0.3)
Total Bilirubin: 0.4 mg/dL (ref 0.2–1.2)
Total Protein: 7.2 g/dL (ref 6.0–8.3)

## 2023-11-28 LAB — LIPID PANEL
Cholesterol: 207 mg/dL — ABNORMAL HIGH (ref 0–200)
HDL: 48 mg/dL (ref >39.00)
LDL Cholesterol: 141 mg/dL — ABNORMAL HIGH (ref 0–99)
NonHDL: 159.08
Total CHOL/HDL Ratio: 4
Triglycerides: 88 mg/dL (ref 0.0–149.0)
VLDL: 17.6 mg/dL (ref 0.0–40.0)

## 2023-11-28 LAB — BASIC METABOLIC PANEL WITH GFR
BUN: 14 mg/dL (ref 6–23)
CO2: 23 meq/L (ref 19–32)
Calcium: 9.1 mg/dL (ref 8.4–10.5)
Chloride: 103 meq/L (ref 96–112)
Creatinine, Ser: 0.98 mg/dL (ref 0.40–1.20)
GFR: 66.8 mL/min (ref >60.00)
Glucose, Bld: 101 mg/dL — ABNORMAL HIGH (ref 70–99)
Potassium: 4.1 meq/L (ref 3.5–5.1)
Sodium: 135 meq/L (ref 135–145)

## 2023-11-28 LAB — TSH: TSH: 1.24 u[IU]/mL (ref 0.35–5.50)

## 2023-11-28 MED ORDER — EZETIMIBE 10 MG PO TABS: 10.0000 mg | ORAL_TABLET | Freq: Every day | ORAL | 1 refills | Status: AC

## 2023-11-28 NOTE — Assessment & Plan Note (Signed)
 Pt's PE WNL w/ exception of BMI.  UTD on pap, mammo.  Pt due for colon cancer screening but loses insurance at end of month.  Will do cologuard instead.  Check labs.  Anticipatory guidance provided.

## 2023-11-28 NOTE — Progress Notes (Signed)
   Subjective:    Patient ID: Angela Evans, female    DOB: [birth year], 53 y.o.   MRN: [id number]  HPI CPE- UTD on mammo (will request).  Due for colonoscopy- pt prefers Cologuard.  Declines flu.  UTD on Tdap and pap.  Patient Care Team    Relationship Specialty Notifications Start End  Mahlon Comer BRAVO, MD PCP - General   03/02/10   Dannielle Bouchard, DO Consulting Physician Obstetrics and Gynecology  04/23/15   [place], [facility]    11/28/23     Health Maintenance  Topic Date Due   HIV Screening  Never done   Hepatitis C Screening  Never done   Pneumococcal Vaccine: 50+ Years (1 of 2 - PCV) Never done   Hepatitis B Vaccines 19-59 Average Risk (1 of 3 - 19+ 3-dose series) Never done   Zoster Vaccines- Shingrix (1 of 2) Never done   MAMMOGRAM  12/30/2022   Colonoscopy  07/28/2023   Influenza Vaccine  Never done   COVID-19 Vaccine (3 - 2025-26 season) 11/19/2023   DTaP/Tdap/Td (2 - Td or Tdap) 06/24/2025   Cervical Cancer Screening (HPV/Pap Cotest)  12/08/2026   HPV VACCINES  Aged Out   Meningococcal B Vaccine  Aged Out      Review of Systems Patient reports no vision/ hearing changes, adenopathy,fever, persistant/recurrent hoarseness , swallowing issues, chest pain, palpitations, edema, persistant/recurrent cough, hemoptysis, dyspnea (rest/exertional/paroxysmal nocturnal), gastrointestinal bleeding (melena, rectal bleeding), abdominal pain, significant heartburn, GU symptoms (dysuria, hematuria, incontinence), Gyn symptoms (abnormal  bleeding, pain),  syncope, focal weakness, memory loss, numbness & tingling, skin/hair/nail changes, abnormal bruising or bleeding, anxiety, or depression.   + 10 lb weight gain + constipation    Objective:   Physical Exam General Appearance:    Alert, cooperative, no distress, appears stated age  Head:    Normocephalic, without obvious abnormality, atraumatic  Eyes:    PERRL, conjunctiva/corneas clear, EOM's intact both eyes   Ears:    Normal TM's and external ear canals, both ears  Nose:   Nares normal, septum midline, mucosa normal, no drainage    or sinus tenderness  Throat:   Lips, mucosa, and tongue normal; teeth and gums normal  Neck:   Supple, symmetrical, trachea midline, no adenopathy;    Thyroid : no enlargement/tenderness/nodules  Back:     Symmetric, no curvature, ROM normal, no CVA tenderness  Lungs:     Clear to auscultation bilaterally, respirations unlabored  Chest Wall:    No tenderness or deformity   Heart:    Regular rate and rhythm, S1 and S2 normal, no murmur, rub   or gallop  Breast Exam:    Deferred to GYN  Abdomen:     Soft, non-tender, bowel sounds active all four quadrants,    no masses, no organomegaly  Genitalia:    Deferred to GYN  Rectal:    Extremities:   Extremities normal, atraumatic, no cyanosis or edema  Pulses:   2+ and symmetric all extremities  Skin:   Skin color, texture, turgor normal, no rashes or lesions  Lymph nodes:   Cervical, supraclavicular, and axillary nodes normal  Neurologic:   CNII-XII intact, normal strength, sensation and reflexes    throughout          Assessment & Plan:

## 2023-11-28 NOTE — Patient Instructions (Signed)
 Follow up in 6 months to recheck cholesterol We'll notify you of your lab results and make any changes if needed Continue to work on healthy diet and regular exercise- you can do it! Complete and return the Cologuard as directed Call with any questions or concerns Stay Safe!  Stay Healthy! Hang in there!!!

## 2023-11-29 LAB — HEPATITIS C ANTIBODY: Hepatitis C Ab: NONREACTIVE

## 2023-11-29 LAB — HIV ANTIBODY (ROUTINE TESTING W REFLEX)
HIV 1&2 Ab, 4th Generation: NONREACTIVE
HIV FINAL INTERPRETATION: NEGATIVE

## 2023-12-03 DIAGNOSIS — Z1211 Encounter for screening for malignant neoplasm of colon: Secondary | ICD-10-CM | POA: Diagnosis not present

## 2023-12-04 ENCOUNTER — Encounter: Payer: Self-pay | Admitting: Gastroenterology

## 2023-12-07 LAB — COLOGUARD: COLOGUARD: NEGATIVE

## 2023-12-10 NOTE — Progress Notes (Signed)
 Patient read my chart message

## 2024-05-28 ENCOUNTER — Ambulatory Visit: Admitting: Family Medicine
